# Patient Record
Sex: Male | Born: 1970 | Race: White | Hispanic: No | Smoking: Never smoker
Health system: Southern US, Community
[De-identification: ages and names within clinical notes are randomized; demographics above are authoritative.]

## PROBLEM LIST (undated history)

## (undated) DIAGNOSIS — I10 Essential (primary) hypertension: Secondary | ICD-10-CM

## (undated) DIAGNOSIS — I2510 Atherosclerotic heart disease of native coronary artery without angina pectoris: Secondary | ICD-10-CM

## (undated) DIAGNOSIS — E079 Disorder of thyroid, unspecified: Secondary | ICD-10-CM

## (undated) DIAGNOSIS — E119 Type 2 diabetes mellitus without complications: Secondary | ICD-10-CM

## (undated) DIAGNOSIS — Z87442 Personal history of urinary calculi: Secondary | ICD-10-CM

## (undated) DIAGNOSIS — L0390 Cellulitis, unspecified: Secondary | ICD-10-CM

## (undated) DIAGNOSIS — E039 Hypothyroidism, unspecified: Secondary | ICD-10-CM

## (undated) HISTORY — PX: THYROID SURGERY: SHX805

---

## 1999-12-04 ENCOUNTER — Ambulatory Visit (HOSPITAL_COMMUNITY): Admission: RE | Admit: 1999-12-04 | Discharge: 1999-12-04 | Payer: Self-pay | Admitting: Internal Medicine

## 1999-12-04 ENCOUNTER — Encounter: Payer: Self-pay | Admitting: *Deleted

## 2000-10-13 ENCOUNTER — Emergency Department (HOSPITAL_COMMUNITY): Admission: EM | Admit: 2000-10-13 | Discharge: 2000-10-13 | Payer: Self-pay | Admitting: Emergency Medicine

## 2001-08-05 ENCOUNTER — Emergency Department (HOSPITAL_COMMUNITY): Admission: EM | Admit: 2001-08-05 | Discharge: 2001-08-05 | Payer: Self-pay | Admitting: Emergency Medicine

## 2001-08-05 ENCOUNTER — Encounter: Payer: Self-pay | Admitting: Emergency Medicine

## 2001-10-04 DIAGNOSIS — L0390 Cellulitis, unspecified: Secondary | ICD-10-CM

## 2001-10-04 HISTORY — DX: Cellulitis, unspecified: L03.90

## 2002-06-26 ENCOUNTER — Emergency Department (HOSPITAL_COMMUNITY): Admission: EM | Admit: 2002-06-26 | Discharge: 2002-06-26 | Payer: Self-pay

## 2002-08-20 ENCOUNTER — Emergency Department (HOSPITAL_COMMUNITY): Admission: EM | Admit: 2002-08-20 | Discharge: 2002-08-20 | Payer: Self-pay | Admitting: Emergency Medicine

## 2002-08-20 ENCOUNTER — Encounter: Payer: Self-pay | Admitting: Emergency Medicine

## 2002-09-11 ENCOUNTER — Encounter: Payer: Self-pay | Admitting: Ophthalmology

## 2002-09-11 ENCOUNTER — Ambulatory Visit (HOSPITAL_COMMUNITY): Admission: RE | Admit: 2002-09-11 | Discharge: 2002-09-11 | Payer: Self-pay | Admitting: Ophthalmology

## 2003-06-22 ENCOUNTER — Encounter: Payer: Self-pay | Admitting: Emergency Medicine

## 2003-06-22 ENCOUNTER — Emergency Department (HOSPITAL_COMMUNITY): Admission: EM | Admit: 2003-06-22 | Discharge: 2003-06-22 | Payer: Self-pay | Admitting: Emergency Medicine

## 2003-06-23 ENCOUNTER — Emergency Department (HOSPITAL_COMMUNITY): Admission: EM | Admit: 2003-06-23 | Discharge: 2003-06-23 | Payer: Self-pay | Admitting: Emergency Medicine

## 2003-06-28 ENCOUNTER — Inpatient Hospital Stay (HOSPITAL_COMMUNITY): Admission: AD | Admit: 2003-06-28 | Discharge: 2003-07-01 | Payer: Self-pay | Admitting: Orthopaedic Surgery

## 2003-07-01 ENCOUNTER — Encounter: Payer: Self-pay | Admitting: Orthopaedic Surgery

## 2003-12-29 ENCOUNTER — Emergency Department (HOSPITAL_COMMUNITY): Admission: AD | Admit: 2003-12-29 | Discharge: 2003-12-29 | Payer: Self-pay | Admitting: Internal Medicine

## 2004-02-08 ENCOUNTER — Ambulatory Visit (HOSPITAL_BASED_OUTPATIENT_CLINIC_OR_DEPARTMENT_OTHER): Admission: RE | Admit: 2004-02-08 | Discharge: 2004-02-08 | Payer: Self-pay | Admitting: Family Medicine

## 2007-05-02 ENCOUNTER — Emergency Department (HOSPITAL_COMMUNITY): Admission: EM | Admit: 2007-05-02 | Discharge: 2007-05-02 | Payer: Self-pay | Admitting: Emergency Medicine

## 2008-01-30 ENCOUNTER — Encounter (INDEPENDENT_AMBULATORY_CARE_PROVIDER_SITE_OTHER): Payer: Self-pay | Admitting: Interventional Radiology

## 2008-01-30 ENCOUNTER — Encounter: Admission: RE | Admit: 2008-01-30 | Discharge: 2008-01-30 | Payer: Self-pay | Admitting: Internal Medicine

## 2008-01-30 ENCOUNTER — Other Ambulatory Visit: Admission: RE | Admit: 2008-01-30 | Discharge: 2008-01-30 | Payer: Self-pay | Admitting: Interventional Radiology

## 2008-04-08 ENCOUNTER — Encounter (HOSPITAL_BASED_OUTPATIENT_CLINIC_OR_DEPARTMENT_OTHER): Payer: Self-pay | Admitting: General Surgery

## 2008-04-08 ENCOUNTER — Ambulatory Visit (HOSPITAL_COMMUNITY): Admission: RE | Admit: 2008-04-08 | Discharge: 2008-04-09 | Payer: Self-pay | Admitting: General Surgery

## 2008-04-18 ENCOUNTER — Emergency Department (HOSPITAL_COMMUNITY): Admission: EM | Admit: 2008-04-18 | Discharge: 2008-04-18 | Payer: Self-pay | Admitting: Family Medicine

## 2008-04-18 ENCOUNTER — Emergency Department (HOSPITAL_COMMUNITY): Admission: EM | Admit: 2008-04-18 | Discharge: 2008-04-18 | Payer: Self-pay | Admitting: Emergency Medicine

## 2009-01-25 ENCOUNTER — Emergency Department (HOSPITAL_COMMUNITY): Admission: EM | Admit: 2009-01-25 | Discharge: 2009-01-25 | Payer: Self-pay | Admitting: Emergency Medicine

## 2009-03-18 ENCOUNTER — Emergency Department (HOSPITAL_COMMUNITY): Admission: EM | Admit: 2009-03-18 | Discharge: 2009-03-18 | Payer: Self-pay | Admitting: Emergency Medicine

## 2010-09-10 ENCOUNTER — Emergency Department (HOSPITAL_COMMUNITY): Admission: EM | Admit: 2010-09-10 | Discharge: 2010-02-12 | Payer: Self-pay | Admitting: Emergency Medicine

## 2010-10-26 ENCOUNTER — Encounter: Payer: Self-pay | Admitting: Emergency Medicine

## 2010-12-22 LAB — CBC
MCHC: 35.7 g/dL (ref 30.0–36.0)
Platelets: 167 10*3/uL (ref 150–400)
RBC: 4.67 MIL/uL (ref 4.22–5.81)

## 2010-12-22 LAB — URINALYSIS, ROUTINE W REFLEX MICROSCOPIC
Bilirubin Urine: NEGATIVE
Glucose, UA: NEGATIVE mg/dL
Hgb urine dipstick: NEGATIVE
Ketones, ur: NEGATIVE mg/dL
Protein, ur: NEGATIVE mg/dL
pH: 6 (ref 5.0–8.0)

## 2010-12-22 LAB — DIFFERENTIAL
Basophils Absolute: 0.1 10*3/uL (ref 0.0–0.1)
Basophils Relative: 1 % (ref 0–1)
Monocytes Relative: 7 % (ref 3–12)
Neutro Abs: 3.8 10*3/uL (ref 1.7–7.7)
Neutrophils Relative %: 62 % (ref 43–77)

## 2010-12-22 LAB — D-DIMER, QUANTITATIVE: D-Dimer, Quant: <0.22 ug/mL-FEU (ref 0.00–0.48)

## 2010-12-22 LAB — BASIC METABOLIC PANEL
BUN: 17 mg/dL (ref 6–23)
CO2: 25 mEq/L (ref 19–32)
Calcium: 8.8 mg/dL (ref 8.4–10.5)
Creatinine, Ser: 1.28 mg/dL (ref 0.4–1.5)
GFR calc Af Amer: >60 mL/min (ref >60)

## 2010-12-22 LAB — POCT CARDIAC MARKERS
CKMB, poc: 3.5 ng/mL (ref 1.0–8.0)
Myoglobin, poc: 87.1 ng/mL (ref 12–200)
Troponin i, poc: <0.05 ng/mL (ref 0.00–0.09)

## 2011-01-11 LAB — URINALYSIS, ROUTINE W REFLEX MICROSCOPIC
Glucose, UA: NEGATIVE mg/dL
Ketones, ur: NEGATIVE mg/dL
Nitrite: NEGATIVE
Protein, ur: NEGATIVE mg/dL
pH: 6.5 (ref 5.0–8.0)

## 2011-01-11 LAB — CBC
HCT: 46.2 % (ref 39.0–52.0)
MCV: 86.5 fL (ref 78.0–100.0)
RBC: 5.34 MIL/uL (ref 4.22–5.81)
WBC: 6.9 10*3/uL (ref 4.0–10.5)

## 2011-01-11 LAB — TROPONIN I
Troponin I: 0.01 ng/mL (ref 0.00–0.06)
Troponin I: 0.02 ng/mL (ref 0.00–0.06)

## 2011-01-11 LAB — CK TOTAL AND CKMB (NOT AT ARMC)
CK, MB: 1.6 ng/mL (ref 0.3–4.0)
CK, MB: 1.8 ng/mL (ref 0.3–4.0)
Relative Index: INVALID (ref 0.0–2.5)
Total CK: 68 U/L (ref 7–232)

## 2011-01-11 LAB — DIFFERENTIAL
Basophils Relative: 1 % (ref 0–1)
Eosinophils Absolute: 0.1 10*3/uL (ref 0.0–0.7)
Lymphs Abs: 2.2 10*3/uL (ref 0.7–4.0)
Monocytes Relative: 5 % (ref 3–12)
Neutro Abs: 4.2 10*3/uL (ref 1.7–7.7)
Neutrophils Relative %: 60 % (ref 43–77)

## 2011-01-11 LAB — BASIC METABOLIC PANEL
Chloride: 103 mEq/L (ref 96–112)
GFR calc Af Amer: >60 mL/min (ref >60)
GFR calc non Af Amer: >60 mL/min (ref >60)
Potassium: 3.6 mEq/L (ref 3.5–5.1)
Sodium: 138 mEq/L (ref 135–145)

## 2011-01-11 LAB — RAPID URINE DRUG SCREEN, HOSP PERFORMED
Barbiturates: NOT DETECTED
Benzodiazepines: NOT DETECTED

## 2011-01-13 LAB — CBC
HCT: 45.3 % (ref 39.0–52.0)
Hemoglobin: 15.9 g/dL (ref 13.0–17.0)
MCHC: 35.2 g/dL (ref 30.0–36.0)
MCV: 85.3 fL (ref 78.0–100.0)
Platelets: 170 10*3/uL (ref 150–400)
RDW: 13.9 % (ref 11.5–15.5)

## 2011-01-13 LAB — URINALYSIS, ROUTINE W REFLEX MICROSCOPIC
Ketones, ur: NEGATIVE mg/dL
Leukocytes, UA: NEGATIVE
Nitrite: NEGATIVE
Protein, ur: NEGATIVE mg/dL
Urobilinogen, UA: 1 mg/dL (ref 0.0–1.0)

## 2011-01-13 LAB — COMPREHENSIVE METABOLIC PANEL
Alkaline Phosphatase: 56 U/L (ref 39–117)
BUN: 14 mg/dL (ref 6–23)
Creatinine, Ser: 1.01 mg/dL (ref 0.4–1.5)
Glucose, Bld: 138 mg/dL — ABNORMAL HIGH (ref 70–99)
Potassium: 3.7 mEq/L (ref 3.5–5.1)
Total Protein: 7.6 g/dL (ref 6.0–8.3)

## 2011-01-13 LAB — URINE MICROSCOPIC-ADD ON

## 2011-02-16 NOTE — Op Note (Signed)
NAME:  [name], [name] NO.:  [id number]   MEDICAL RECORD NO.:  [id number]          PATIENT TYPE:  OIB   LOCATION:  5125                         FACILITY:  [facility]   PHYSICIAN:  Leonie Man, M.D.   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   PATHOLOGY:  Pending.   PROCEDURE:  Right thyroid lobectomy and isthmusectomy.   SURGEON:  Leonie Man, MD   ASSISTANT:  Dr. Luanna Salk.   ANESTHESIA:  General.   Note, Kirk Lewis is a 40 year old gentleman with an enlarging mass of  the right thyroid which on biopsy shows a Hurthle cell lesion. Carcinoma  is not ruled out.  He comes to the operating room now after the risks  and potential benefits of surgery have been discussed with him. Specific  risks to include hypoparathyroidism and/or recurrent laryngeal nerve  injury.  He understands and gives his consent to surgery.   PROCEDURE:  The patient was positioned supinely, and following the  induction of satisfactory general anesthesia, the neck and anterior  chest were prepped and draped to be included in a sterile operative  field.  Positive identification of the patient as Kirk Lewis and the  procedure to be done as right hemithyroidectomy and isthmusectomy was  carried out.  I made a transverse collar incision in the neck extending  from the anterior border of the sternocleidomastoid muscle on the right  and onto the left, deepening this through skin and subcutaneous tissue  and across the platysma muscle.  A superior myocutaneous flap was raised  up to the thyroid cartilage and an inferior myocutaneous flap was  carried down to the sternal notch.  The strap muscles were opened in the  midline and dissection was carried over onto the right side of the neck.  The right thyroid lobe was then mobilized completely and the superior  thyroid vessels were secured with ties of 2-0 silk and clips and then  transected.  The thyroid was then dissected carefully from surrounding  tissues being careful to avoid the superior and inferior parathyroid  glands and the area of the recurrent laryngeal nerve.  The thyroid was  dissected free from the trachea.  A small area of thyroid tissue was  left at the tubercle of Zuckerkandl and dissection was carried across  the midline, taking both the isthmus and the pyramidal lobe along with  the right thyroid lobe.  The specimen was forwarded for pathologic  evaluation and frozen section of this confirmed a Hurthle cell lesion;  however, the diagnosis of carcinoma could not be made at this time.  We  chose to end the procedure at this time.  The right neck was then  thoroughly checked for hemostasis.  Additional bleeding points were  treated with electrocautery.  All areas of dissection were covered with  Surgicel.  Sponge, instrument, and sharp counts were doubly verified and  the strap muscles were closed in the midline with interrupted 3-0 Vicryl  sutures.  The subcutaneous tissues and platysma muscle were closed with  interrupted 3-0 Vicryl sutures.  Skin was closed with 5-0 Monocryl  suture and reinforced with Steri-Strips.  Sterile dressings were  applied.  The anesthetic was reversed and the patient was moved from the  operating room to the recovery room in stable condition.  He tolerated  the procedure well.      Leonie Man, M.D.  Electronically Signed     PB/MEDQ  D:  04/08/2008  T:  04/09/2008  Job:  [id number]

## 2011-02-19 NOTE — Discharge Summary (Signed)
   NAME:  Kirk Lewis, Kirk Lewis                          ACCOUNT NO.:  [id number]   MEDICAL RECORD NO.:  [id number]                   PATIENT TYPE:  INP   LOCATION:  5023                                 FACILITY:  [facility]   PHYSICIAN:  Lubertha Basque. Jerl Santos, M.D.             DATE OF BIRTH:  June 04, 1971   DATE OF ADMISSION:  06/28/2003  DATE OF DISCHARGE:  07/01/2003                                 DISCHARGE SUMMARY   ADMISSION DIAGNOSIS:  Prepatellar cellulitis, left knee.   DISCHARGE DIAGNOSIS:  Prepatellar cellulitis, left knee.   OPERATIONS:  None.   HISTORY OF PRESENT ILLNESS:  Kirk Lewis is a 40 year old white male who is a  patient of Dr. Maebelle Munroe who is a doctor in our practice.  In the office on  the day of admission, Dr. Althea Charon had seen this patient for evaluation and  noted he had significant cellulitis and swelling in his left prepatellar  bursa.  He has been on Tequin, Percocet and has been making minimal to no  advance.  Rocephin was given at the time of his office visit.  Because of  pain and redness, it was decided he go to the hospital IV antibiotics.   PERTINENT LABORATORY AND X-RAY DATA:  WBC 11.1, RBC 4.84, hemoglobin 14.5,  hematocrit ___________, _________0.7; neutrophils of 78.  Routine chemistry  showed a glucose of _____________________________________________________  AST _________________________.   HOSPITAL COURSE:  The patient was admitted from our office, he was placed on  IV antibiotics which included Kefzol 2 g q.8h IV.  A moist heating pad was  put on his leg and he was given a pain medication which essentially was  Percocet and morphine as necessary.  He was on IV Ancef 2 g q.8h.  His  aspiration culture and sensitivity revealed Staph with multiple  sensitivities.  He lessening of pain.  A PICC line was put in because he was  going to receive antibiotics for approximately two weeks, this was going to  be regulated and supplied by [place], and he was  discharged home.   CONDITION ON DISCHARGE:  Improved.   DISCHARGE MEDICATIONS:  1. He will be on 1 g of Ancef IV q.8h. for two weeks.  2. Percocet 1-2 p.o. q.4-6h. p.r.n. for pain.   The home agency is to regulate this IV antibiotic was [place].  His pain  management was Percocet.   ACTIVITY:  Moist heat to his knee, crutches as needed.  Wound care not  applicable.    DIET:  Unrestricted.   FOLLOWUP:  He will return to our office in one week's time for recheck.      Lindwood Qua, P.A.                    Lubertha Basque Jerl Santos, M.D.    MC/MEDQ  D:  07/15/2003  T:  07/15/2003  Job:  [id number]

## 2011-07-01 ENCOUNTER — Emergency Department (HOSPITAL_COMMUNITY): Payer: Managed Care, Other (non HMO)

## 2011-07-01 ENCOUNTER — Observation Stay (HOSPITAL_COMMUNITY)
Admission: EM | Admit: 2011-07-01 | Discharge: 2011-07-02 | Disposition: A | Discharge status: Home / Self Care | Payer: Managed Care, Other (non HMO) | Attending: Internal Medicine | Admitting: Internal Medicine

## 2011-07-01 DIAGNOSIS — Z7982 Long term (current) use of aspirin: Secondary | ICD-10-CM | POA: Insufficient documentation

## 2011-07-01 DIAGNOSIS — R911 Solitary pulmonary nodule: Secondary | ICD-10-CM | POA: Insufficient documentation

## 2011-07-01 DIAGNOSIS — R079 Chest pain, unspecified: Principal | ICD-10-CM | POA: Insufficient documentation

## 2011-07-01 DIAGNOSIS — E785 Hyperlipidemia, unspecified: Secondary | ICD-10-CM | POA: Insufficient documentation

## 2011-07-01 DIAGNOSIS — R109 Unspecified abdominal pain: Secondary | ICD-10-CM | POA: Insufficient documentation

## 2011-07-01 DIAGNOSIS — Z79899 Other long term (current) drug therapy: Secondary | ICD-10-CM | POA: Insufficient documentation

## 2011-07-01 DIAGNOSIS — K219 Gastro-esophageal reflux disease without esophagitis: Secondary | ICD-10-CM | POA: Insufficient documentation

## 2011-07-01 DIAGNOSIS — K2970 Gastritis, unspecified, without bleeding: Secondary | ICD-10-CM | POA: Insufficient documentation

## 2011-07-01 DIAGNOSIS — Z87442 Personal history of urinary calculi: Secondary | ICD-10-CM | POA: Insufficient documentation

## 2011-07-01 DIAGNOSIS — Z8249 Family history of ischemic heart disease and other diseases of the circulatory system: Secondary | ICD-10-CM | POA: Insufficient documentation

## 2011-07-01 DIAGNOSIS — G4730 Sleep apnea, unspecified: Secondary | ICD-10-CM | POA: Insufficient documentation

## 2011-07-01 DIAGNOSIS — I10 Essential (primary) hypertension: Secondary | ICD-10-CM | POA: Insufficient documentation

## 2011-07-01 LAB — CBC
HCT: 42.3 % (ref 39.0–52.0)
MCHC: 35.9 g/dL (ref 30.0–36.0)
MCV: 84.3
Platelets: 159
RBC: 4.95
RDW: 13 % (ref 11.5–15.5)
WBC: 5.9 10*3/uL (ref 4.0–10.5)
WBC: 4.9

## 2011-07-01 LAB — CARDIAC PANEL(CRET KIN+CKTOT+MB+TROPI)
CK, MB: 1.9 ng/mL (ref 0.3–4.0)
Relative Index: INVALID (ref 0.0–2.5)
Relative Index: INVALID (ref 0.0–2.5)
Total CK: 69 U/L (ref 7–232)
Total CK: 60 U/L (ref 7–232)

## 2011-07-01 LAB — BASIC METABOLIC PANEL
CO2: 25 mEq/L (ref 19–32)
Calcium: 9.3 mg/dL (ref 8.4–10.5)
Creatinine, Ser: 1 mg/dL (ref 0.50–1.35)
GFR calc Af Amer: >60 mL/min (ref >60)
GFR calc non Af Amer: >60 mL/min (ref >60)
Sodium: 137 mEq/L (ref 135–145)

## 2011-07-01 LAB — DIFFERENTIAL
Basophils Absolute: 0 10*3/uL (ref 0.0–0.1)
Basophils Absolute: 0
Basophils Relative: 0 % (ref 0–1)
Eosinophils Absolute: 0.1
Eosinophils Relative: 1 % (ref 0–5)
Eosinophils Relative: 2
Lymphocytes Relative: 24 % (ref 12–46)
Lymphocytes Relative: 32
Monocytes Absolute: 0.4 10*3/uL (ref 0.1–1.0)
Monocytes Absolute: 0.6
Neutro Abs: 3.9 10*3/uL (ref 1.7–7.7)

## 2011-07-01 LAB — COMPREHENSIVE METABOLIC PANEL
ALT: 25
AST: 20
Albumin: 4.2
Alkaline Phosphatase: 55
CO2: 26
Chloride: 106
Creatinine, Ser: 0.99
GFR calc Af Amer: >60
GFR calc non Af Amer: >60
Potassium: 4.5
Sodium: 139
Total Bilirubin: 1.1

## 2011-07-01 LAB — PROTIME-INR: Prothrombin Time: 13.7

## 2011-07-01 LAB — POCT I-STAT TROPONIN I: Troponin i, poc: 0 ng/mL (ref 0.00–0.08)

## 2011-07-01 MED ORDER — IOHEXOL 300 MG/ML  SOLN
100.0000 mL | Freq: Once | INTRAMUSCULAR | Status: AC | PRN
  Filled 2011-07-01: qty 100

## 2011-07-02 ENCOUNTER — Ambulatory Visit (HOSPITAL_COMMUNITY)
Admit: 2011-07-02 | Discharge: 2011-07-02 | Disposition: A | Discharge status: Home / Self Care | Payer: Managed Care, Other (non HMO) | Source: Ambulatory Visit | Attending: Cardiovascular Disease | Admitting: Cardiovascular Disease

## 2011-07-02 DIAGNOSIS — R079 Chest pain, unspecified: Secondary | ICD-10-CM | POA: Insufficient documentation

## 2011-07-02 LAB — CARDIAC PANEL(CRET KIN+CKTOT+MB+TROPI)
CK, MB: 1.8 ng/mL (ref 0.3–4.0)
Relative Index: INVALID (ref 0.0–2.5)
Total CK: 49 U/L (ref 7–232)
Troponin I: <0.3 ng/mL (ref <0.30)

## 2011-07-02 LAB — CBC
MCH: 29.5 pg (ref 26.0–34.0)
MCV: 83.8 fL (ref 78.0–100.0)
Platelets: 203 10*3/uL (ref 150–400)
RDW: 13.5 % (ref 11.5–15.5)

## 2011-07-02 LAB — DIFFERENTIAL
Basophils Relative: 1 % (ref 0–1)
Eosinophils Absolute: 0.2 10*3/uL (ref 0.0–0.7)
Eosinophils Relative: 2 % (ref 0–5)
Lymphs Abs: 2.5 10*3/uL (ref 0.7–4.0)
Monocytes Relative: 8 % (ref 3–12)

## 2011-07-02 LAB — POCT URINALYSIS DIP (DEVICE)
Glucose, UA: NEGATIVE
Ketones, ur: NEGATIVE
Nitrite: NEGATIVE
Specific Gravity, Urine: 1.025

## 2011-07-02 LAB — LIPID PANEL
Cholesterol: 126 mg/dL (ref 0–200)
VLDL: UNDETERMINED mg/dL (ref 0–40)

## 2011-07-02 LAB — COMPREHENSIVE METABOLIC PANEL
AST: 23 U/L (ref 0–37)
Albumin: 3.9 g/dL (ref 3.5–5.2)
Calcium: 9.1 mg/dL (ref 8.4–10.5)
Creatinine, Ser: 1.21 mg/dL (ref 0.50–1.35)
Sodium: 133 mEq/L — ABNORMAL LOW (ref 135–145)
Total Protein: 7 g/dL (ref 6.0–8.3)

## 2011-07-02 LAB — HEMOGLOBIN A1C: Mean Plasma Glucose: 126 mg/dL — ABNORMAL HIGH (ref <117)

## 2011-07-02 MED ORDER — TECHNETIUM TC 99M TETROFOSMIN IV KIT
30.0000 | PACK | Freq: Once | INTRAVENOUS | Status: AC | PRN
  Administered 2011-07-02: 30 via INTRAVENOUS

## 2011-07-02 MED ORDER — TECHNETIUM TC 99M TETROFOSMIN IV KIT
10.0000 | PACK | Freq: Once | INTRAVENOUS | Status: AC | PRN
  Administered 2011-07-02: 10 via INTRAVENOUS

## 2011-07-02 NOTE — Consult Note (Signed)
NAME:  MARIANO, DOSHI NO.:  [id number]  MEDICAL RECORD NO.:  [id number]  LOCATION:  1422                         FACILITY[facility]  [facility]  PHYSICIAN:  Verne Carrow, MDDATE OF BIRTH:  [birth year]  DATE OF CONSULTATION:  07/01/2011 DATE OF DISCHARGE:                                CONSULTATION   PRIMARY CARE PHYSICIAN:  Dr. Chauncey Cruel  REASON FOR CONSULTATION:  Chest pain.  HISTORY OF PRESENT ILLNESS:  Mr. Doswell is a pleasant 40 year old Caucasian male with a history of hypertension, but no other chronic medical conditions who presents to the [facility] today with complaints of substernal chest pressure.  The patient does have a strong family history of coronary artery disease.  Both his father and brother were heavy smokers.  His father died of a myocardial infarction at age 83.  His brother had a myocardial infarction at age 81.  The patient is active and abides by a good healthy diet.  He exercises frequently.  He tells me that yesterday he began to have substernal chest pressure with no radiation.  There was no associated shortness of breath, diaphoresis, or nausea.  The patient presented to the emergency department after the pain did not resolve for 18 hours. He continues to have substernal chest pressure now.  He also describes recent acid reflux after meals and abdominal bloating after meals.  This has been occurring for several weeks.  He tells me that he had an exercise treadmill stress test in his primary care physician's office 1 year ago, which was normal.  His initial point-of-care troponin is negative.  PAST MEDICAL HISTORY:  Hypertension.  PAST SURGICAL HISTORY: 1. Partial thyroidectomy 2. Knee surgery.  ALLERGIES:  No known drug allergies.  HOME MEDICATIONS: 1. Aspirin 81 mg once daily. 2. Norvasc 5 mg once daily. 3. Lisinopril/HCTZ 20/25 mg once daily.  SOCIAL HISTORY:  The patient is divorced, has 3  children.  He denies use of tobacco or illicit drugs.  He uses alcohol socially.  FAMILY HISTORY:  The patient's mother died from diabetes-related complications.  His father died at age 89 from myocardial infarction. His brother had a myocardial infarction at age 35.  REVIEW OF SYSTEMS:  As stated in the history of present illness, is otherwise negative.  PHYSICAL EXAMINATION:  VITAL SIGNS:  Temperature 98.7, pulse 78 and regular, respirations 12 and unlabored, blood pressure 118/70.  GENERAL: He is a pleasant, young Caucasian male, in no acute distress.  He is alert and oriented x3. NECK:  No JVD.  No carotid bruits.  No thyromegaly.  No lymphadenopathy. SKIN:  Warm and dry. MUSCULOSKELETAL:  Moves all extremities equally. PSYCHIATRIC:  Mood and affect are appropriate. NEUROLOGIC:  Nonfocal. HEENT:  Normal. CARDIOVASCULAR:  Regular rate and rhythm without murmurs, gallops, or rubs noted. LUNGS:  Clear to auscultation bilaterally. ABDOMEN:  Soft, nontender.  Bowel sounds present. EXTREMITIES:  No evidence of edema.  Pulses are 2+ in all extremities.  DIAGNOSTIC STUDIES: 1. Laboratory values show hemoglobin of 15, creatinine 1.0, potassium     3.8, platelets 172.  Point-of-care troponin 0.0. 2. Chest x-ray shows no acute disease. 3. 12-lead EKG shows sinus  rhythm with no ischemic changes.  ASSESSMENT/PLAN:  Chest pain.  This is a 40 year old male with risk factors for coronary artery disease including hypertension and family history of coronary artery disease.  His glucose today is elevated, although he has no documented history of diabetes mellitus.  His initial troponin is negative, indicating no evidence of ischemia.  At this time, I agree with admitting the patient cycling cardiac enzymes.  I would also check a hemoglobin A1c in the morning as well as fasting lipids. We will also check an echocardiogram and make the patient n.p.o. at midnight.  If his cardiac markers are  negative, I would plan on an exercise treadmill stress Myoview in the morning to exclude ischemia. If this is normal, then I would pursue a GI workup.  We will be glad to follow with you.     Verne Carrow, MD     CM/MEDQ  D:  07/01/2011  T:  07/02/2011  Job:  [id number]  cc:   Dr. Chauncey Cruel  Electronically Signed by Verne Carrow MD on 07/02/2011 03:07:47 PM

## 2011-07-12 NOTE — H&P (Signed)
NAME:  Kirk Lewis, Kirk Lewis                ACCOUNT NO.:  [id number]  MEDICAL RECORD NO.:  [id number]  LOCATION:  1422                         FACILITY:  [facility]  PHYSICIAN:  Esty Ahuja, DO         DATE OF BIRTH:  [birth year]  DATE OF ADMISSION:  07/01/2011 DATE OF DISCHARGE:                             HISTORY & PHYSICAL   CHIEF COMPLAINT:  Chest pain.  HISTORY OF PRESENT ILLNESS:  The patient is a 40 year old male who states that he was at work yesterday, not particularly engaged in anything exertional, when he developed substernal chest pressure.  He states it felt like something was sitting on his chest.  It has been 10/10 in severity.  It has lasted continuously since yesterday.  It is made worse by eating, otherwise has no palliative or provocative factors.  It is accompanied by mild shortness of breath, nausea, and diaphoresis.  The patient denies any vomiting, fever, or chills with this.  PAST MEDICAL HISTORY: 1. Kidney stones. 2. Obstructive sleep apnea. 3. Hypertension.  PAST SURGICAL HISTORY:  Significant for partial thyroidectomy.  SOCIAL HISTORY:  No tobacco.  No alcohol.  No recreational drug use.  FAMILY HISTORY:  Father had an MI at age 77.  Brother had an MI at age 61.  ALLERGIES:  NKDA.  MEDICATIONS AT HOME: 1. Milk of magnesia 30 cc p.o. q.12 h. p.r.n. constipation. 2. Gas-X 1 tablet every 12 hours as needed for gas. 3. Aleve 220 mg 2 tablets every 12 hours p.r.n. pain 4. Enteric-coated aspirin 81 mg 2 tablets daily. 5. Ibuprofen 200 mg 4 tablets every 4 hours as needed for pain. 6. Lisinopril/hydrochlorothiazide 20/25 one p.o. daily. 7. Amlodipine 5 mg 1 p.o. daily.  REVIEW OF SYSTEMS:  CONSTITUTIONAL:  Negative for fever.  Negative for chills.  Negative for weakness.  Negative for fatigue.  CNS:  No headaches.  No seizures.  No limb weakness.  ENT:  No nasal congestion. No throat pain.  No coryza.  CARDIOVASCULAR:  Positive for chest pain/pressure.   Negative for palpitations.  Negative for orthopnea. RESPIRATORY:  Positive for mild shortness breath.  No cough.  No wheeze. No sputum production.  GASTROINTESTINAL:  Positive for a full feeling after he eats and bloating.  Positive for some mild constipation lately. GENITOURINARY:  No dysuria.  No hematuria.  No urinary frequency. RENAL:  No flank pain.  No swelling.  No pruritus.  SKIN:  No rashes. No sores.  No lesions.  HEMATOLOGIC:  No easy bruising.  No purpura.  No clots.  LYMPH:  No lymphadenopathy.  No painful lymph nodes, no specific lymph swelling.  PSYCHIATRIC:  No anxiety.  No depression.  No insomnia.  PHYSICAL EXAMINATION:  VITAL SIGNS:  Temperature 98.8, pulse 91, respiratory rate 17, blood pressure 144/83, O2 sat 100% room air. GENERAL:  The patient is awake, alert, and ordered x3.  He is moderately obese, well-nourished, well-developed male who is in mild distress from chest/abdominal discomfort at this time.  He states everything got worse after he ate. EYES:  Pupils are equal, round, and react to light and accommodation. External ocular movements bilaterally intact.  Sclerae nonicteric, noninjected. MOUTH:  Oral mucosa moist.  No lesions.  No sores. PHARYNX:  Clean erythema.  No exudate. NECK:  Negative for JVD.  Negative for thyromegaly.  Negative for lymphadenopathy. HEART:  Regular rate and rhythm at 80 beats per minute without murmur, ectopy, or gallops.  No lateral PMI.  No thrills. LUNGS:  Clear to auscultation bilaterally without wheezes, rales, or rhonchi.  No increased work of breathing.  No tactile fremitus. ABDOMEN:  Moderately obese, soft, nontender, nondistended.  Positive bowel sounds.  No hepatosplenomegaly.  No hernias palpated. EXTREMITIES:  Negative for cyanosis, clubbing, or edema.  The patient has positive dorsalis pedis and popliteal pulses bilaterally.  No carotid bruits bilaterally. NEUROLOGIC:  Cranial nerves II through XII gross intact.   Motor and sensory intact.  LABORATORY DATA:  WBC is 5.9, hemoglobin 15.2, hematocrit 42.3, platelets 172.  Sodium 137, potassium 3.8, chloride 101, CO2 of 25, BUN 16, creatinine is 1, glucose 156, calcium of 9.3.  Abdominal ultrasound showed fatty infiltration of the liver and bilateral nonobstructive renal calculi.  CT of the abdomen and pelvis was also performed demonstrating a 5 mm nodule on the right lower lobe as the patient is at low risk for lung cancer as he has no smoking or family history, I feel this is low risk.  Portal chest x-ray is negative for an acute cardiopulmonary pathology.  EKG demonstrated normal sinus rhythm.  ASSESSMENT: 1. Chest pain, differential diagnosis includes,     a.     Gastrointestinal, likely gastritis and/or esophagitis, given      the patient's heavy use of NSAIDs and the fact his symptoms      worsened after a meal.     b.     Acute coronary syndrome, the patient certainly has risk      factors for this, he has hypertension, he may have high      cholesterol, he feels like he probably does but he is not sure,      and he has a strong family history for early heart disease.     c.     Musculoskeletal. 2. The patient clearly does have some gastroesophageal reflux disease     symptoms. 3. Hypertension. 4. Hyperlipidemia.  PLAN: 1. Rule out MI by EKG and enzyme criteria. 2. Protonix. 3. Consult cardiology. 4. Control blood pressure. 5. Check a lipid panel.  I spent 40 minutes on this admission.          ______________________________ Fran Lowes, DO     AS/MEDQ  D:  07/01/2011  T:  07/02/2011  Job:  [id number]  Electronically Signed by Fran Lowes DO on 07/12/2011 03:46:29 PM

## 2011-07-13 NOTE — Discharge Summary (Signed)
NAME:  Kirk Lewis, GASCOIGNE NO.:  [id number]  MEDICAL RECORD NO.:  [id number]  LOCATION:                                 FACILITY:  PHYSICIAN:  Marinda Elk, M.D.DATE OF BIRTH:  Mar 01, 1971  DATE OF ADMISSION:  07/02/2011 DATE OF DISCHARGE:                              DISCHARGE SUMMARY   PRIMARY CARE DOCTOR:  Dr. Jearld Fenton  CONSULTANTS:  Verne Carrow, MD, Cardiology.  DISCHARGE DIAGNOSES: 1. Atypical chest pain probably secondary to gastroesophageal reflux     disease/gastritis. 2. Gastroesophageal reflux disease. 3. Hyperlipidemia.  DISCHARGE MEDICATIONS: 1. Protonix 40 mg p.o. b.i.d. 2. TriCor 140 mg p.o. daily. 3. Amlodipine 10 mg daily. 4. Aspirin 81 mg daily. 5. Keflex 1 tablet q.12 hours p.r.n. 6. Lisinopril/hydrochlorothiazide 20/25 mg one tab daily. 7. Milk of Magnesia 30 mL daily.  CONSULTANTS:  Cardiology, Verne Carrow, MD.  PROCEDURES PERFORMED:  Non-invasive exercise stress test.  No chest pain, shortness of breath.  Good exercise tolerance, breathing coming back.  No chest pain.  No EKG changes.  Vital signs stable.  No evidence of ischemia.  Normal left ventricular wall motion abnormality, EF of 53%.  Abdominal ultrasound showed no gallstones, small gallbladder sludge, diffuse fatty liver, bilateral renal calculi obstructions.  CT scan of the abdomen and pelvis showed appendix well visualized; normal ileum; renal calculi, right larger than left.  No hydronephrosis.  It revealed a calcified nodule in the right lower lobe.  Followup recommendation as above.  BRIEF ADMITTING HISTORY AND PHYSICAL:  This is a 40 year old male with a past medical history of hypertension and a strong family history of coronary artery disease, who developed substernal chest pain when he was sleeping.  It woke him up from his sleep, 10/10, continued since yesterday, made worse by eating, accompanied with shortness of breath, nausea,  vomiting, and diaphoresis.  So we were asked to admit him to further evaluate.  LABS:  On admission, shows a white count of 5, hemoglobin of 15, platelet count 172.  Sodium 137, potassium 3.8, chloride 101, bicarb of 25, BUN 16, creatinine 1, glucose 156, calcium 9.3.  BRIEF HOSPITAL COURSE: 1. Atypical chest pain most likely secondary to GERD/gastritis. 2. GERD/gastritis.  Exercise stress test was done, which was negative.     2-D echo is pending at the time of this dictation.  The patient has     been consuming NSAIDs, naproxen, and ibuprofen for several days     now, and his pain is made better with eating.  So this is most     likely secondary to NSAIDS.  I have done aggressive education on     dieting and exercise.  He has agreed.  I will start him on a PPI. 3. GERD.  No leg or arm symptoms.  Hemoglobin is stable.  No loss of     weight.  Now hemoglobin is stable, so we will start him on a PPI     twice a day, and he will follow up with his primary care doctor. 4. 5 mm noncalcified nodule.  He will need a repeated CT scan of the     abdomen and pelvis  in 12 months.  I will defer this to Dr. Norma Fredrickson. 5. Hyperlipidemia.  His triglycerides are quite high, 445.  I will     start him on TriCor.  His LFTs in the hospital were within normal     limits.  This will be repeated in 3 months by his primary care     doctor.  VITALS ON DAY OF DISCHARGE:  Shows a temperature of 98, pulse 70, respirations 18, blood pressure 110/76, he was sating 97% on room air.  LABS ON DAY OF DISCHARGE:  Shows a hemoglobin A1c of 6.0, triglycerides 447.  His HDL is 17.  His cardiac enzymes continue to be negative.  His sodium is 133, potassium 3.9, chloride 96, bicarb 30, glucose 124, BUN 16, creatinine 1.2.  LFTs within normal limits.  He has white count of 7.7, hemoglobin of 15, platelet count 203, ANC of 4.4.  The patient will follow up with Dr. Norma Fredrickson in 6 weeks here.  We will check his LFTs.  We will  continue to reinforce diet and exercise to try to continue to decrease his risk factors for any cardiac events.  The patient is currently stable.     Marinda Elk, M.D.     AF/MEDQ  D:  07/02/2011  T:  07/02/2011  Job:  [id number]  Electronically Signed by Marinda Elk M.D. on 07/13/2011 08:31:14 AM

## 2011-07-19 LAB — POCT URINALYSIS DIP (DEVICE)
Bilirubin Urine: NEGATIVE
Nitrite: NEGATIVE
pH: 6.5

## 2012-02-13 ENCOUNTER — Emergency Department (HOSPITAL_COMMUNITY): Payer: Managed Care, Other (non HMO)

## 2012-02-13 ENCOUNTER — Encounter (HOSPITAL_COMMUNITY): Payer: Self-pay | Admitting: Emergency Medicine

## 2012-02-13 ENCOUNTER — Inpatient Hospital Stay (HOSPITAL_COMMUNITY)
Admission: EM | Admit: 2012-02-13 | Discharge: 2012-02-15 | DRG: 603 | Disposition: A | Discharge status: Home / Self Care | Payer: Managed Care, Other (non HMO) | Attending: Internal Medicine | Admitting: Internal Medicine

## 2012-02-13 DIAGNOSIS — Z8639 Personal history of other endocrine, nutritional and metabolic disease: Secondary | ICD-10-CM | POA: Diagnosis present

## 2012-02-13 DIAGNOSIS — E663 Overweight: Secondary | ICD-10-CM | POA: Diagnosis present

## 2012-02-13 DIAGNOSIS — L0390 Cellulitis, unspecified: Secondary | ICD-10-CM | POA: Diagnosis present

## 2012-02-13 DIAGNOSIS — G4710 Hypersomnia, unspecified: Secondary | ICD-10-CM

## 2012-02-13 DIAGNOSIS — L03039 Cellulitis of unspecified toe: Secondary | ICD-10-CM

## 2012-02-13 DIAGNOSIS — R739 Hyperglycemia, unspecified: Secondary | ICD-10-CM | POA: Diagnosis present

## 2012-02-13 DIAGNOSIS — I10 Essential (primary) hypertension: Secondary | ICD-10-CM

## 2012-02-13 DIAGNOSIS — K219 Gastro-esophageal reflux disease without esophagitis: Secondary | ICD-10-CM | POA: Diagnosis present

## 2012-02-13 DIAGNOSIS — R7989 Other specified abnormal findings of blood chemistry: Secondary | ICD-10-CM

## 2012-02-13 DIAGNOSIS — L02619 Cutaneous abscess of unspecified foot: Secondary | ICD-10-CM

## 2012-02-13 DIAGNOSIS — L02419 Cutaneous abscess of limb, unspecified: Principal | ICD-10-CM | POA: Diagnosis present

## 2012-02-13 DIAGNOSIS — E876 Hypokalemia: Secondary | ICD-10-CM | POA: Diagnosis present

## 2012-02-13 DIAGNOSIS — Z6836 Body mass index (BMI) 36.0-36.9, adult: Secondary | ICD-10-CM

## 2012-02-13 DIAGNOSIS — G4733 Obstructive sleep apnea (adult) (pediatric): Secondary | ICD-10-CM | POA: Diagnosis present

## 2012-02-13 DIAGNOSIS — R509 Fever, unspecified: Secondary | ICD-10-CM | POA: Diagnosis present

## 2012-02-13 DIAGNOSIS — E119 Type 2 diabetes mellitus without complications: Secondary | ICD-10-CM | POA: Diagnosis present

## 2012-02-13 DIAGNOSIS — G4730 Sleep apnea, unspecified: Secondary | ICD-10-CM

## 2012-02-13 DIAGNOSIS — E079 Disorder of thyroid, unspecified: Secondary | ICD-10-CM | POA: Diagnosis present

## 2012-02-13 HISTORY — DX: Essential (primary) hypertension: I10

## 2012-02-13 HISTORY — DX: Cellulitis, unspecified: L03.90

## 2012-02-13 HISTORY — DX: Disorder of thyroid, unspecified: E07.9

## 2012-02-13 LAB — BASIC METABOLIC PANEL
BUN: 16 mg/dL (ref 6–23)
Calcium: 9.2 mg/dL (ref 8.4–10.5)
Creatinine, Ser: 1.11 mg/dL (ref 0.50–1.35)
GFR calc Af Amer: >90 mL/min (ref >90)
GFR calc non Af Amer: 81 mL/min — ABNORMAL LOW (ref >90)

## 2012-02-13 LAB — DIFFERENTIAL
Basophils Relative: 0 % (ref 0–1)
Eosinophils Absolute: 0.1 10*3/uL (ref 0.0–0.7)
Eosinophils Relative: 1 % (ref 0–5)
Monocytes Absolute: 0.7 10*3/uL (ref 0.1–1.0)
Monocytes Relative: 7 % (ref 3–12)
Neutrophils Relative %: 85 % — ABNORMAL HIGH (ref 43–77)

## 2012-02-13 LAB — CBC
HCT: 40.9 % (ref 39.0–52.0)
Hemoglobin: 14.4 g/dL (ref 13.0–17.0)
MCH: 28.8 pg (ref 26.0–34.0)
MCHC: 35.2 g/dL (ref 30.0–36.0)

## 2012-02-13 LAB — GLUCOSE, CAPILLARY: Glucose-Capillary: 153 mg/dL — ABNORMAL HIGH (ref 70–99)

## 2012-02-13 LAB — MAGNESIUM: Magnesium: 1.8 mg/dL (ref 1.5–2.5)

## 2012-02-13 LAB — SEDIMENTATION RATE: Sed Rate: 3 mm/hr (ref 0–16)

## 2012-02-13 MED ORDER — ENOXAPARIN SODIUM 40 MG/0.4ML ~~LOC~~ SOLN: 40.0000 mg | SUBCUTANEOUS | Status: DC

## 2012-02-13 MED ORDER — MORPHINE SULFATE 2 MG/ML IJ SOLN
1.0000 mg | Freq: Four times a day (QID) | INTRAMUSCULAR | Status: DC | PRN
  Administered 2012-02-14: 1 mg via INTRAVENOUS
  Filled 2012-02-13: qty 1

## 2012-02-13 MED ORDER — LISINOPRIL-HYDROCHLOROTHIAZIDE 20-25 MG PO TABS: 1.0000 | ORAL_TABLET | Freq: Every day | ORAL | Status: DC

## 2012-02-13 MED ORDER — FENTANYL CITRATE 0.05 MG/ML IJ SOLN
100.0000 ug | Freq: Once | INTRAMUSCULAR | Status: AC
  Administered 2012-02-13: 100 ug via INTRAVENOUS
  Filled 2012-02-13: qty 2

## 2012-02-13 MED ORDER — OXYCODONE HCL 5 MG PO TABS
5.0000 mg | ORAL_TABLET | ORAL | Status: DC | PRN
  Administered 2012-02-13 – 2012-02-15 (×7): 5 mg via ORAL
  Filled 2012-02-13 (×7): qty 1

## 2012-02-13 MED ORDER — FENTANYL CITRATE 0.05 MG/ML IJ SOLN
50.0000 ug | Freq: Once | INTRAMUSCULAR | Status: AC
  Administered 2012-02-13: 50 ug via INTRAVENOUS
  Filled 2012-02-13: qty 2

## 2012-02-13 MED ORDER — PANTOPRAZOLE SODIUM 40 MG PO TBEC
40.0000 mg | DELAYED_RELEASE_TABLET | Freq: Every day | ORAL | Status: DC
Start: 2012-02-13 — End: 2012-02-15
  Administered 2012-02-13 – 2012-02-15 (×3): 40 mg via ORAL
  Filled 2012-02-13 (×3): qty 1

## 2012-02-13 MED ORDER — LISINOPRIL 20 MG PO TABS
20.0000 mg | ORAL_TABLET | Freq: Every day | ORAL | Status: DC
  Administered 2012-02-13 – 2012-02-15 (×3): 20 mg via ORAL
  Filled 2012-02-13 (×3): qty 1

## 2012-02-13 MED ORDER — FENTANYL CITRATE 0.05 MG/ML IJ SOLN
50.0000 ug | Freq: Once | INTRAMUSCULAR | Status: AC
  Administered 2012-02-13: 50 ug via INTRAVENOUS

## 2012-02-13 MED ORDER — ACETAMINOPHEN 325 MG PO TABS
650.0000 mg | ORAL_TABLET | Freq: Four times a day (QID) | ORAL | Status: DC | PRN
  Administered 2012-02-13 – 2012-02-14 (×4): 650 mg via ORAL
  Filled 2012-02-13 (×4): qty 2

## 2012-02-13 MED ORDER — ONDANSETRON HCL 4 MG PO TABS
4.0000 mg | ORAL_TABLET | Freq: Four times a day (QID) | ORAL | Status: DC | PRN
  Administered 2012-02-15: 4 mg via ORAL
  Filled 2012-02-13: qty 1

## 2012-02-13 MED ORDER — IBUPROFEN 200 MG PO TABS
400.0000 mg | ORAL_TABLET | Freq: Once | ORAL | Status: AC
  Administered 2012-02-13: 400 mg via ORAL

## 2012-02-13 MED ORDER — ENOXAPARIN SODIUM 60 MG/0.6ML ~~LOC~~ SOLN
60.0000 mg | SUBCUTANEOUS | Status: DC
  Administered 2012-02-13 – 2012-02-14 (×2): 60 mg via SUBCUTANEOUS
  Filled 2012-02-13 (×3): qty 0.6

## 2012-02-13 MED ORDER — ACETAMINOPHEN 325 MG PO TABS
ORAL_TABLET | ORAL | Status: AC
  Filled 2012-02-13: qty 2

## 2012-02-13 MED ORDER — POTASSIUM CHLORIDE CRYS ER 20 MEQ PO TBCR
40.0000 meq | EXTENDED_RELEASE_TABLET | ORAL | Status: AC
  Administered 2012-02-13 (×2): 40 meq via ORAL
  Filled 2012-02-13 (×2): qty 2

## 2012-02-13 MED ORDER — IBUPROFEN 200 MG PO TABS
ORAL_TABLET | ORAL | Status: AC
  Administered 2012-02-13: 400 mg via ORAL
  Filled 2012-02-13: qty 2

## 2012-02-13 MED ORDER — SODIUM CHLORIDE 0.9 % IV SOLN
INTRAVENOUS | Status: DC
  Administered 2012-02-13: 02:00:00 via INTRAVENOUS

## 2012-02-13 MED ORDER — SODIUM CHLORIDE 0.9 % IV SOLN
INTRAVENOUS | Status: DC
  Administered 2012-02-13: 21:00:00 via INTRAVENOUS
  Administered 2012-02-14: 20 mL/h via INTRAVENOUS

## 2012-02-13 MED ORDER — HYDROCHLOROTHIAZIDE 25 MG PO TABS
25.0000 mg | ORAL_TABLET | Freq: Every day | ORAL | Status: DC
  Administered 2012-02-13 – 2012-02-15 (×3): 25 mg via ORAL
  Filled 2012-02-13 (×3): qty 1

## 2012-02-13 MED ORDER — INSULIN ASPART 100 UNIT/ML ~~LOC~~ SOLN
0.0000 [IU] | Freq: Three times a day (TID) | SUBCUTANEOUS | Status: DC
  Administered 2012-02-13: 2 [IU] via SUBCUTANEOUS
  Administered 2012-02-14 (×2): 1 [IU] via SUBCUTANEOUS
  Administered 2012-02-14: 2 [IU] via SUBCUTANEOUS
  Administered 2012-02-15: 1 [IU] via SUBCUTANEOUS
  Administered 2012-02-15: 2 [IU] via SUBCUTANEOUS

## 2012-02-13 MED ORDER — VANCOMYCIN HCL IN DEXTROSE 1-5 GM/200ML-% IV SOLN
1000.0000 mg | Freq: Once | INTRAVENOUS | Status: AC
  Administered 2012-02-13: 1000 mg via INTRAVENOUS
  Filled 2012-02-13: qty 200

## 2012-02-13 MED ORDER — ONDANSETRON HCL 4 MG/2ML IJ SOLN: 4.0000 mg | Freq: Four times a day (QID) | INTRAMUSCULAR | Status: DC | PRN

## 2012-02-13 MED ORDER — IBUPROFEN 800 MG PO TABS
400.0000 mg | ORAL_TABLET | Freq: Three times a day (TID) | ORAL | Status: DC | PRN
  Administered 2012-02-13 – 2012-02-15 (×4): 400 mg via ORAL
  Filled 2012-02-13 (×4): qty 1

## 2012-02-13 MED ORDER — IBUPROFEN 200 MG PO TABS
ORAL_TABLET | ORAL | Status: AC
  Filled 2012-02-13: qty 1

## 2012-02-13 MED ORDER — FENTANYL CITRATE 0.05 MG/ML IJ SOLN
INTRAMUSCULAR | Status: AC
  Filled 2012-02-13: qty 2

## 2012-02-13 MED ORDER — AMLODIPINE BESYLATE 5 MG PO TABS
5.0000 mg | ORAL_TABLET | Freq: Every day | ORAL | Status: DC
  Administered 2012-02-13 – 2012-02-15 (×3): 5 mg via ORAL
  Filled 2012-02-13 (×3): qty 1

## 2012-02-13 MED ORDER — ACETAMINOPHEN 325 MG PO TABS
650.0000 mg | ORAL_TABLET | Freq: Once | ORAL | Status: AC
  Administered 2012-02-13: 650 mg via ORAL

## 2012-02-13 NOTE — ED Notes (Signed)
Pt c/o R knee pain, presents w/ redness, swelling, c/o chills and fever at home. Hx of cellulitis of R knee, states "same as before."

## 2012-02-13 NOTE — ED Notes (Signed)
Pt c/o R knee pain, redness and swelling onset this am. + fever at home

## 2012-02-13 NOTE — Progress Notes (Signed)
[facility] called for biomed to come check pts home CPAP.

## 2012-02-13 NOTE — Progress Notes (Signed)
Pt already placed on home CPAP and is tolerating well.  Pt encouraged to notify RN/ RT of any concerns.

## 2012-02-13 NOTE — ED Notes (Signed)
Pt provided PO liquids

## 2012-02-13 NOTE — ED Notes (Signed)
Pt transported to xray 

## 2012-02-13 NOTE — ED Notes (Signed)
MD at bedside. 

## 2012-02-13 NOTE — ED Provider Notes (Signed)
History     CSN: [id number]  Arrival date & time 02/13/12  0043   First MD Initiated Contact with Patient 02/13/12 0129      Chief Complaint  Patient presents with  . Knee Pain    (Consider location/radiation/quality/duration/timing/severity/associated sxs/prior treatment) HPI 41 year old male with pain in his right knee suggest the morning. The pain has come on gradually and is associated with erythema and tenderness. The pain is moderate to severe, worse with movement of the knee or palpation of the patella. It has been associated with chills and fever. He has a history of similar symptoms in the past (which was diagnosed as prepatellar cellulitis per old records).  Past Medical History  Diagnosis Date  . Cellulitis 2003    R knee  . Hypertension   . Thyroid disease     Past Surgical History  Procedure Date  . Thyroid surgery     No family history on file.  History  Substance Use Topics  . Smoking status: Never Smoker   . Smokeless tobacco: Not on file  . Alcohol Use: No      Review of Systems  All other systems reviewed and are negative.    Allergies  Review of patient's allergies indicates no known allergies.  Home Medications  No current outpatient prescriptions on file.  BP 99/71  Pulse 113  Temp(Src) 102.5 F (39.2 C) (Rectal)  Resp 22  Ht 6' (1.829 m)  Wt 270 lb (122.471 kg)  BMI 36.62 kg/m2  SpO2 100%  Physical Exam General: Well-developed, well-nourished male in no acute distress; appearance consistent with age of record HENT: normocephalic, atraumatic Eyes: pupils equal round and reactive to light; extraocular muscles intact Neck: supple Heart: regular rate and rhythm; tachycardia Lungs: clear to auscultation bilaterally Abdomen: soft; nondistended Extremities: No deformity; full range of motion; pulses normal; trace edema of lower legs; erythema of right knee with tenderness to palpation or percussion of patella Neurologic: Awake,  alert and oriented; motor function intact in all extremities and symmetric; no facial droop Skin: Warm and dry Psychiatric: Normal mood and affect   ED Course  Procedures (including critical care time)     MDM   Nursing notes and vitals signs, including pulse oximetry, reviewed.  Summary of this visit's results, reviewed by myself:  Labs:  Results for orders placed during the hospital encounter of 02/13/12  CBC      Component Value Range   WBC 10.5  4.0 - 10.5 (K/uL)   RBC 5.00  4.22 - 5.81 (MIL/uL)   Hemoglobin 14.4  13.0 - 17.0 (g/dL)   HCT 36.6  44.0 - 34.7 (%)   MCV 81.8  78.0 - 100.0 (fL)   MCH 28.8  26.0 - 34.0 (pg)   MCHC 35.2  30.0 - 36.0 (g/dL)   RDW 42.5  95.6 - 38.7 (%)   Platelets 165  150 - 400 (K/uL)  DIFFERENTIAL      Component Value Range   Neutrophils Relative 85 (*) 43 - 77 (%)   Neutro Abs 8.9 (*) 1.7 - 7.7 (K/uL)   Lymphocytes Relative 8 (*) 12 - 46 (%)   Lymphs Abs 0.8  0.7 - 4.0 (K/uL)   Monocytes Relative 7  3 - 12 (%)   Monocytes Absolute 0.7  0.1 - 1.0 (K/uL)   Eosinophils Relative 1  0 - 5 (%)   Eosinophils Absolute 0.1  0.0 - 0.7 (K/uL)   Basophils Relative 0  0 - 1 (%)  Basophils Absolute 0.0  0.0 - 0.1 (K/uL)  BASIC METABOLIC PANEL      Component Value Range   Sodium 134 (*) 135 - 145 (mEq/L)   Potassium 3.4 (*) 3.5 - 5.1 (mEq/L)   Chloride 98  96 - 112 (mEq/L)   CO2 23  19 - 32 (mEq/L)   Glucose, Bld 188 (*) 70 - 99 (mg/dL)   BUN 16  6 - 23 (mg/dL)   Creatinine, Ser 4.09  0.50 - 1.35 (mg/dL)   Calcium 9.2  8.4 - 81.1 (mg/dL)   GFR calc non Af Amer 81 (*) >90 (mL/min)   GFR calc Af Amer >90  >90 (mL/min)    Imaging Studies: Dg Knee 2 Views Right  02/13/2012  *RADIOLOGY REPORT*  Clinical Data: Knee pain  RIGHT KNEE - 1-2 VIEW  Comparison: None.  Findings: No significant effusion.  No evidence of acute fracture or subluxation of the right knee.  Bony exostosis arising from the medial tibial metaphysis.  Bone cortex and trabecular  architecture appear intact.  No bone destruction.  IMPRESSION: Exostosis arising from the medial tibial metaphysis.  No acute fractures.  Original Report Authenticated By: Marlon Pel, M.D.            Hanley Seamen, MD 02/13/12 1236

## 2012-02-13 NOTE — ED Notes (Signed)
Lab bedside.

## 2012-02-13 NOTE — ED Notes (Signed)
Attempt x1 to call report to 3E states RN will call back. 

## 2012-02-13 NOTE — H&P (Addendum)
PCP:   No primary provider on file.   Chief Complaint:  RLE and knee pain, erythema and swelling  HPI: 41 y/o male with pmh of HTN, overweight, GERD; remote R MSSA pre-patellar cellulitis(9-10 years ago) and hx of thyroid disease; came to the hospital complaining of right knee and RLE pain, erythema and mild swelling. Also reports some chills. Symptoms started about 24-36 hours prior to admission. Denies CP, SOB, nausea, vomiting, abdominal pain or any other complaints. In the Ed patient with high fever(up to 102.5); also with mild hypokalemia. [facility] called to admit patient for further evaluation and treatment due to extension of cellulitis and high fever.  Allergies:  No Known Allergies    Past Medical History  Diagnosis Date  . Cellulitis 2003    R knee  . Hypertension   . Thyroid disease     Past Surgical History  Procedure Date  . Thyroid surgery     Prior to Admission medications   Medication Sig Start Date End Date Taking? Authorizing Provider  amLODipine (NORVASC) 5 MG tablet Take 5 mg by mouth daily.   Yes Historical Provider, MD  lisinopril-hydrochlorothiazide (PRINZIDE,ZESTORETIC) 20-25 MG per tablet Take 1 tablet by mouth daily.   Yes Historical Provider, MD  pantoprazole (PROTONIX) 40 MG tablet Take 40 mg by mouth daily.   Yes Historical Provider, MD    Social History:  reports that he has never smoked. He does not have any smokeless tobacco history on file. He reports that he does not drink alcohol or use illicit drugs.  History reviewed. No pertinent family history.  Review of Systems:  Negative except as mentioned on HPI.   Physical Exam: Blood pressure 113/74, pulse 89, temperature 98.3 F (36.8 C), temperature source Oral, resp. rate 18, height 6' (1.829 m), weight 122.8 kg (270 lb 11.6 oz), SpO2 98.00%. General: Well-developed, overweight, well hydrated; in no acute distress. HENT: normocephalic, atraumatic  Eyes: No icterus, EOMI; pupils equal reactive  to light; L pupil larger than right one chronically) Neck: supple  Heart: regular rate and rhythm; no murmurs.  Lungs: clear to auscultation bilaterally  Abdomen: soft; nondistended  Extremities: No deformity; full range of motion; pulses normal; trace edema of RLE, also with erythema of right knee with tenderness to palpation or percussion of patella. Redness extended from above knee until almost his ankle.   Neurologic: Awake, alert and oriented; motor function intact in all extremities and symmetric; no facial droop  Psychiatric: Normal mood and affect   Labs on Admission:  Results for orders placed during the hospital encounter of 02/13/12 (from the past 48 hour(s))  CBC     Status: Normal   Collection Time   02/13/12  1:39 AM      Component Value Range Comment   WBC 10.5  4.0 - 10.5 (K/uL)    RBC 5.00  4.22 - 5.81 (MIL/uL)    Hemoglobin 14.4  13.0 - 17.0 (g/dL)    HCT 40.9  81.1 - 91.4 (%)    MCV 81.8  78.0 - 100.0 (fL)    MCH 28.8  26.0 - 34.0 (pg)    MCHC 35.2  30.0 - 36.0 (g/dL)    RDW 78.2  95.6 - 21.3 (%)    Platelets 165  150 - 400 (K/uL)   DIFFERENTIAL     Status: Abnormal   Collection Time   02/13/12  1:39 AM      Component Value Range Comment   Neutrophils Relative 85 (*) 43 -  77 (%)    Neutro Abs 8.9 (*) 1.7 - 7.7 (K/uL)    Lymphocytes Relative 8 (*) 12 - 46 (%)    Lymphs Abs 0.8  0.7 - 4.0 (K/uL)    Monocytes Relative 7  3 - 12 (%)    Monocytes Absolute 0.7  0.1 - 1.0 (K/uL)    Eosinophils Relative 1  0 - 5 (%)    Eosinophils Absolute 0.1  0.0 - 0.7 (K/uL)    Basophils Relative 0  0 - 1 (%)    Basophils Absolute 0.0  0.0 - 0.1 (K/uL)   BASIC METABOLIC PANEL     Status: Abnormal   Collection Time   02/13/12  1:39 AM      Component Value Range Comment   Sodium 134 (*) 135 - 145 (mEq/L)    Potassium 3.4 (*) 3.5 - 5.1 (mEq/L)    Chloride 98  96 - 112 (mEq/L)    CO2 23  19 - 32 (mEq/L)    Glucose, Bld 188 (*) 70 - 99 (mg/dL)    BUN 16  6 - 23 (mg/dL)     Creatinine, Ser 1.61  0.50 - 1.35 (mg/dL)    Calcium 9.2  8.4 - 10.5 (mg/dL)    GFR calc non Af Amer 81 (*) >90 (mL/min)    GFR calc Af Amer >90  >90 (mL/min)   SEDIMENTATION RATE     Status: Normal   Collection Time   02/13/12  3:29 AM      Component Value Range Comment   Sed Rate 3  0 - 16 (mm/hr)     Radiological Exams on Admission: No results found.  Assessment/Plan 1-RLE pre-patellar Cellulitis: patient denies any injury, animal bites or scratches. Reports been ok until yesterday prior to admission. Now with erythema, rubor and dolor. Had previous hx of MSSA RLE cellulitis in the past and had high fever. Will continue vancomycin, continue physical measures and follow response. Blood cx pending. If LE worsen will get CT or MRI to examine extension of infection.  2-Fever: 2/2 #1; will continue PRN antipyretics and antibiotics. Will follow cx.   3-HTN (hypertension): continue home regimen (amlodipine, lisinopril and HCTZ).  4-Hx of thyroid disease: not sure what type of disease was; will check TSH and Free T4.  5-Overweight (BMI 36): discussion about weight loss and low calorie diet sustained with patient. He is willing to start a weight loss program.  6-Hyperglycemia: no hx of DM; CBG 188. Will check A1C and start modify carb diet and will also start SSI.  7-Hypokalemia: due to HCTZ. Will replete and will check Mg.  8-GERD (gastroesophageal reflux disease): continue protonix.  9-OSA: continue QHS CPAP  10-DVT: lovenox   Time Spent on Admission: 50 minutes  Nazair Fortenberry [facility] [phone]  02/13/2012, 1:35 PM

## 2012-02-14 DIAGNOSIS — L02619 Cutaneous abscess of unspecified foot: Secondary | ICD-10-CM

## 2012-02-14 DIAGNOSIS — R7989 Other specified abnormal findings of blood chemistry: Secondary | ICD-10-CM

## 2012-02-14 DIAGNOSIS — G4710 Hypersomnia, unspecified: Secondary | ICD-10-CM

## 2012-02-14 DIAGNOSIS — G4730 Sleep apnea, unspecified: Secondary | ICD-10-CM

## 2012-02-14 DIAGNOSIS — L03039 Cellulitis of unspecified toe: Secondary | ICD-10-CM

## 2012-02-14 DIAGNOSIS — I10 Essential (primary) hypertension: Secondary | ICD-10-CM

## 2012-02-14 LAB — CBC
HCT: 38.2 % — ABNORMAL LOW (ref 39.0–52.0)
Hemoglobin: 13.2 g/dL (ref 13.0–17.0)
MCH: 28.8 pg (ref 26.0–34.0)
MCHC: 34.6 g/dL (ref 30.0–36.0)

## 2012-02-14 LAB — LIPID PANEL: LDL Cholesterol: 38 mg/dL (ref 0–99)

## 2012-02-14 LAB — TSH: TSH: 3.143 u[IU]/mL (ref 0.350–4.500)

## 2012-02-14 LAB — BASIC METABOLIC PANEL
BUN: 12 mg/dL (ref 6–23)
Calcium: 8.5 mg/dL (ref 8.4–10.5)
Creatinine, Ser: 1.16 mg/dL (ref 0.50–1.35)
GFR calc non Af Amer: 77 mL/min — ABNORMAL LOW (ref >90)
Glucose, Bld: 161 mg/dL — ABNORMAL HIGH (ref 70–99)
Sodium: 134 mEq/L — ABNORMAL LOW (ref 135–145)

## 2012-02-14 LAB — GLUCOSE, CAPILLARY: Glucose-Capillary: 139 mg/dL — ABNORMAL HIGH (ref 70–99)

## 2012-02-14 LAB — HEMOGLOBIN A1C: Mean Plasma Glucose: 148 mg/dL — ABNORMAL HIGH (ref <117)

## 2012-02-14 MED ORDER — VANCOMYCIN HCL 1000 MG IV SOLR
1250.0000 mg | Freq: Two times a day (BID) | INTRAVENOUS | Status: DC
  Administered 2012-02-15 (×2): 1250 mg via INTRAVENOUS
  Filled 2012-02-14 (×2): qty 1250

## 2012-02-14 MED ORDER — LIVING WELL WITH DIABETES BOOK
Freq: Once | Status: AC
  Administered 2012-02-14: 11:00:00
  Filled 2012-02-14: qty 1

## 2012-02-14 MED ORDER — VANCOMYCIN HCL 1000 MG IV SOLR
2000.0000 mg | INTRAVENOUS | Status: AC
  Administered 2012-02-14: 2000 mg via INTRAVENOUS
  Filled 2012-02-14: qty 2000

## 2012-02-14 MED ORDER — VANCOMYCIN HCL IN DEXTROSE 1-5 GM/200ML-% IV SOLN
1000.0000 mg | INTRAVENOUS | Status: DC
  Filled 2012-02-14: qty 200

## 2012-02-14 NOTE — Progress Notes (Signed)
ANTIBIOTIC CONSULT NOTE - INITIAL  Pharmacy Consult for Vancomycin Indication: Fever, Cellulitis RLE  No Known Allergies  Patient Measurements: Height: 6' (182.9 cm) Weight: 270 lb 11.6 oz (122.8 kg) IBW/kg (Calculated) : 77.6   Vital Signs: Temp: 98 F (36.7 C) (05/13 0459) Temp src: Oral (05/13 0459) BP: 111/72 mmHg (05/13 0459) Pulse Rate: 84  (05/13 0459) Intake/Output from previous day: 05/12 0701 - 05/13 0700 In: 1400 [P.O.:1400] Out: 1200 [Urine:1200] Intake/Output from this shift:    Labs:  Basename 02/14/12 0335 02/13/12 0139  WBC 8.1 10.5  HGB 13.2 14.4  PLT 127* 165  LABCREA -- --  CREATININE 1.16 1.11   Estimated Creatinine Clearance: 113.4 ml/min (by C-G formula based on Cr of 1.16).    Microbiology: Recent Results (from the past 720 hour(s))  CULTURE, BLOOD (ROUTINE X 2)     Status: Normal (Preliminary result)   Collection Time   02/13/12  2:56 AM      Component Value Range Status Comment   Specimen Description BLOOD RIGHT ARM   Final    Special Requests BOTTLES DRAWN AEROBIC ONLY [facility]   Final    Culture  Setup Time [id number]   Final    Culture     Final    Value:        BLOOD CULTURE RECEIVED NO GROWTH TO DATE CULTURE WILL BE HELD FOR 5 DAYS BEFORE ISSUING A FINAL NEGATIVE REPORT   Report Status PENDING   Incomplete   CULTURE, BLOOD (ROUTINE X 2)     Status: Normal (Preliminary result)   Collection Time   02/13/12  3:05 AM      Component Value Range Status Comment   Specimen Description BLOOD RIGHT HAND   Final    Special Requests BOTTLES DRAWN AEROBIC AND ANAEROBIC [facility]   Final    Culture  Setup Time [id number]   Final    Culture     Final    Value:        BLOOD CULTURE RECEIVED NO GROWTH TO DATE CULTURE WILL BE HELD FOR 5 DAYS BEFORE ISSUING A FINAL NEGATIVE REPORT   Report Status PENDING   Incomplete     Medical History: Past Medical History  Diagnosis Date  . Cellulitis 2003    R knee  . Hypertension   . Thyroid disease      Medications:  Anti-infectives     Start     Dose/Rate Route Frequency Ordered Stop   02/14/12 1200   vancomycin (VANCOCIN) IVPB 1000 mg/200 mL premix  Status:  Discontinued        1,000 mg 200 mL/hr over 60 Minutes Intravenous Every 24 hours 02/14/12 1111 02/14/12 1139   02/14/12 1145   vancomycin (VANCOCIN) 2,000 mg in sodium chloride 0.9 % 500 mL IVPB        2,000 mg 250 mL/hr over 120 Minutes Intravenous STAT 02/14/12 1141 02/15/12 1145   02/13/12 0530   vancomycin (VANCOCIN) IVPB 1000 mg/200 mL premix        1,000 mg 200 mL/hr over 60 Minutes Intravenous  Once 02/13/12 0509 02/13/12 0641         Assessment:  Kirk Lewis admitted 5/12 w knee pain. Vanc 1g IV x 1 dose in ER given 5/12 ~ 0600.  RLE erythema worse and continues to be tender  Vancomycin ordered to continue per pharmacy today  Pt is obese. BMI 36.7. Scr 1.16, CrCL 56ml/min/1.73m2  WBC wnl, Tm 99.3 (Tc 98), BCx x  2 = NGTD  Goal of Therapy:  Vancomycin trough level 15-20 mcg/ml  Plan:   Vancomycin 2g IV x 1 loading dose  Vancomycin 1250 mg IV q12h maintenance dose  Follow labs, vitals and cultures  Vancomycin trough at steady state  Adjust dose as necessary.  Gwen Her PharmD  [phone] 02/14/2012 11:46 AM

## 2012-02-14 NOTE — Progress Notes (Signed)
Inpatient Diabetes Program Recommendations  AACE/ADA: New Consensus Statement on Inpatient Glycemic Control (2009)  Target Ranges:  Prepandial:   less than 140 mg/dL      Peak postprandial:   less than 180 mg/dL (1-2 hours)      Critically ill patients:  140 - 180 mg/dL   Reason for Visit: Hgb A1c >6.5  Inpatient Diabetes Program Recommendations HgbA1C: Note Hgb A1C of 6.8 which can be diagnostic of diabetes  Note: If patient has diagnosis of diabetes, will need Dietitian Consult, Staff Nurses will need to provide basic diabetes education, and Outpatient Diabetes Education follow-up is recommended. Kirk Lewis S. Kirk Lincoln, RN, CNS, CDE  [phone])

## 2012-02-14 NOTE — Progress Notes (Signed)
Subjective: No high fever; no CP or SOB. RLE still tender and with worsening erythema.  Objective: Vital signs in last 24 hours: Temp:  [98 F (36.7 C)-99.3 F (37.4 C)] 98 F (36.7 C) (05/13 0459) Pulse Rate:  [84-101] 84  (05/13 0459) Resp:  [16-19] 16  (05/13 0459) BP: (110-130)/(72-80) 111/72 mmHg (05/13 0459) SpO2:  [95 %-98 %] 95 % (05/13 0459) Weight change: 0.329 kg (11.6 oz) Last BM Date: 02/12/12  Intake/Output from previous day: 05/12 0701 - 05/13 0700 In: 1400 [P.O.:1400] Out: 1200 [Urine:1200]     Physical Exam: General: Alert, awake, oriented x3, in no acute distress. HEENT: No bruits, no goiter. Heart: Regular rate and rhythm, without murmurs, rubs, gallops. Lungs: Clear to auscultation bilaterally. Abdomen: Soft, nontender, nondistended, positive bowel sounds. Extremities: No clubbing or cyanosis; trace edema and worsening erythema on his RLE; also tender to palpation. Neuro: Grossly intact, nonfocal.   Lab Results: Basic Metabolic Panel:  Basename 02/14/12 0335 02/13/12 1505 02/13/12 0139  NA 134* -- 134*  K 3.8 -- 3.4*  CL 101 -- 98  CO2 24 -- 23  GLUCOSE 161* -- 188*  BUN 12 -- 16  CREATININE 1.16 -- 1.11  CALCIUM 8.5 -- 9.2  MG -- 1.8 --  PHOS -- -- --   CBC:  Basename 02/14/12 0335 02/13/12 0139  WBC 8.1 10.5  NEUTROABS -- 8.9*  HGB 13.2 14.4  HCT 38.2* 40.9  MCV 83.4 81.8  PLT 127* 165   CBG:  Basename 02/14/12 0816 02/13/12 1716  GLUCAP 147* 153*   Hemoglobin A1C:  Basename 02/13/12 1505  HGBA1C 6.8*   Fasting Lipid Panel:  Basename 02/14/12 0335  CHOL 97  HDL 20*  LDLCALC 38  TRIG 161*  CHOLHDL 4.9  LDLDIRECT --   Thyroid Function Tests:  Basename 02/13/12 1505  TSH 2.970  T4TOTAL --  FREET4 0.93  T3FREE --  THYROIDAB --   Urine Drug Screen: Drugs of Abuse     Component Value Date/Time   LABOPIA NONE DETECTED 03/18/2009 0259   COCAINSCRNUR NONE DETECTED 03/18/2009 0259   LABBENZ NONE DETECTED 03/18/2009  0259   AMPHETMU NONE DETECTED 03/18/2009 0259   THCU NONE DETECTED 03/18/2009 0259   LABBARB  Value: NONE DETECTED        DRUG SCREEN FOR MEDICAL PURPOSES ONLY.  IF CONFIRMATION IS NEEDED FOR ANY PURPOSE, NOTIFY LAB WITHIN 5 DAYS.        LOWEST DETECTABLE LIMITS FOR URINE DRUG SCREEN Drug Class       Cutoff (ng/mL) Amphetamine      1000 Barbiturate      200 Benzodiazepine   200 Tricyclics       300 Opiates          300 Cocaine          300 THC              50 03/18/2009 0259   Misc. Labs:  Recent Results (from the past 240 hour(s))  CULTURE, BLOOD (ROUTINE X 2)     Status: Normal (Preliminary result)   Collection Time   02/13/12  2:56 AM      Component Value Range Status Comment   Specimen Description BLOOD RIGHT ARM   Final    Special Requests BOTTLES DRAWN AEROBIC ONLY [facility]   Final    Culture  Setup Time [id number]   Final    Culture     Final    Value:  BLOOD CULTURE RECEIVED NO GROWTH TO DATE CULTURE WILL BE HELD FOR 5 DAYS BEFORE ISSUING A FINAL NEGATIVE REPORT   Report Status PENDING   Incomplete   CULTURE, BLOOD (ROUTINE X 2)     Status: Normal (Preliminary result)   Collection Time   02/13/12  3:05 AM      Component Value Range Status Comment   Specimen Description BLOOD RIGHT HAND   Final    Special Requests BOTTLES DRAWN AEROBIC AND ANAEROBIC [facility]   Final    Culture  Setup Time [id number]   Final    Culture     Final    Value:        BLOOD CULTURE RECEIVED NO GROWTH TO DATE CULTURE WILL BE HELD FOR 5 DAYS BEFORE ISSUING A FINAL NEGATIVE REPORT   Report Status PENDING   Incomplete     Studies/Results: Dg Knee 2 Views Right  02/13/2012  *RADIOLOGY REPORT*  Clinical Data: Knee pain  RIGHT KNEE - 1-2 VIEW  Comparison: None.  Findings: No significant effusion.  No evidence of acute fracture or subluxation of the right knee.  Bony exostosis arising from the medial tibial metaphysis.  Bone cortex and trabecular architecture appear intact.  No bone destruction.  IMPRESSION:  Exostosis arising from the medial tibial metaphysis.  No acute fractures.  Original Report Authenticated By: Marlon Pel, M.D.    Medications: Scheduled Meds:   . amLODipine  5 mg Oral Daily  . enoxaparin (LOVENOX) injection  60 mg Subcutaneous Q24H  . hydrochlorothiazide  25 mg Oral Daily  . insulin aspart  0-9 Units Subcutaneous TID WC  . lisinopril  20 mg Oral Daily  . living well with diabetes book   Does not apply Once  . pantoprazole  40 mg Oral Q1200  . potassium chloride  40 mEq Oral Q4H  . vancomycin  1,000 mg Intravenous Q24H  . DISCONTD: enoxaparin  40 mg Subcutaneous Q24H  . DISCONTD: lisinopril-hydrochlorothiazide  1 tablet Oral Daily   Continuous Infusions:   . sodium chloride 75 mL/hr at 02/13/12 2048  . DISCONTD: sodium chloride 750 mL/hr (02/13/12 0353)   PRN Meds:.acetaminophen, ibuprofen, morphine injection, ondansetron (ZOFRAN) IV, ondansetron, oxyCODONE  Assessment/Plan: 1-RLE pre-patellar Cellulitis: patient denies any injury, animal bites or scratches. Reports still significant pain; but endorses mild improving. Erythema is worse (as expected); no high fevers. Blood cx negative up to date. If LE worsen will get CT or MRI to examine extension of infection. Continue vancomycin one more day; if he continue improving home tomorrow on PO antibiotics.  2-Fever: 2/2 #1; will continue PRN antipyretics and antibiotics. Will follow cx.   3-HTN (hypertension): continue home regimen (amlodipine, lisinopril and HCTZ).   4-Hx of thyroid disease: Per patient removal of benign nodule. Normal free T4; TSH pending.   5-Overweight (BMI 36): discussion about weight loss, exercise  and low calorie diet sustained with patient. He is willing to start a weight loss program. Will get dietician consult.   6-Hyperglycemia: no hx of DM; CBG 188. Will check A1C 6.8. Will get dietician consult; continue SSI and start on metformin at discharge.  7-Hypokalemia: due to HCTZ.  Repleted. Mg WNL.  8-GERD (gastroesophageal reflux disease): continue protonix.   9-OSA: continue QHS CPAP   10-DVT: lovenox     LOS: 1 day   Tekoa Amon [facility] [phone]  02/14/2012, 11:13 AM

## 2012-02-15 DIAGNOSIS — L02619 Cutaneous abscess of unspecified foot: Secondary | ICD-10-CM

## 2012-02-15 DIAGNOSIS — L03039 Cellulitis of unspecified toe: Secondary | ICD-10-CM

## 2012-02-15 DIAGNOSIS — R7989 Other specified abnormal findings of blood chemistry: Secondary | ICD-10-CM

## 2012-02-15 DIAGNOSIS — G4710 Hypersomnia, unspecified: Secondary | ICD-10-CM

## 2012-02-15 DIAGNOSIS — G4730 Sleep apnea, unspecified: Secondary | ICD-10-CM

## 2012-02-15 DIAGNOSIS — I10 Essential (primary) hypertension: Secondary | ICD-10-CM

## 2012-02-15 LAB — GLUCOSE, CAPILLARY
Glucose-Capillary: 131 mg/dL — ABNORMAL HIGH (ref 70–99)
Glucose-Capillary: 151 mg/dL — ABNORMAL HIGH (ref 70–99)

## 2012-02-15 MED ORDER — DOXYCYCLINE HYCLATE 100 MG PO TABS: 100.0000 mg | ORAL_TABLET | Freq: Two times a day (BID) | ORAL | Status: AC

## 2012-02-15 MED ORDER — LIVING WELL WITH DIABETES BOOK
Freq: Once | Status: DC
  Filled 2012-02-15: qty 1

## 2012-02-15 MED ORDER — OXYCODONE HCL 5 MG PO TABS: 5.0000 mg | ORAL_TABLET | Freq: Four times a day (QID) | ORAL | Status: AC | PRN

## 2012-02-15 MED ORDER — METFORMIN HCL 500 MG PO TABS: 500.0000 mg | ORAL_TABLET | Freq: Two times a day (BID) | ORAL | Status: DC

## 2012-02-15 NOTE — Discharge Summary (Signed)
Physician Discharge Summary  Patient ID: Kirk Lewis MRN: [id number] DOB/AGE: 1970/11/13 41 y.o.  Admit date: 02/26/12 Discharge date: 02/15/2012  Primary Care Physician:  No primary provider on file.   Discharge Diagnoses:   .Cellulitis .Fever .HTN (hypertension) .Hx of thyroid disease .Overweight .Diabetes (A1C 6.8; fasting CBG's in the 160-180 range) .Hypokalemia .GERD (gastroesophageal reflux disease)  Medication List  As of 02/15/2012  2:51 PM   TAKE these medications         amLODipine 5 MG tablet   Commonly known as: NORVASC   Take 5 mg by mouth daily.      doxycycline 100 MG tablet   Commonly known as: VIBRA-TABS   Take 1 tablet (100 mg total) by mouth 2 (two) times daily.      lisinopril-hydrochlorothiazide 20-25 MG per tablet   Commonly known as: PRINZIDE,ZESTORETIC   Take 1 tablet by mouth daily.      metFORMIN 500 MG tablet   Commonly known as: GLUCOPHAGE   Take 1 tablet (500 mg total) by mouth 2 (two) times daily with a meal.      oxyCODONE 5 MG immediate release tablet   Commonly known as: Oxy IR/ROXICODONE   Take 1 tablet (5 mg total) by mouth every 6 (six) hours as needed.      pantoprazole 40 MG tablet   Commonly known as: PROTONIX   Take 40 mg by mouth daily.             Disposition and Follow-up:  Patient discharge in stable condition and with instructions to finish 10 days of twice a day doxycyline. He will keep himself hydrated and will follow discharge instructions. Patient with elevated CBG and A1C during admission; advised to follow low carb diet and to start metformin BId.  Consults:  Diabetes coordinator and dietician.   Significant Diagnostic Studies:  Dg Knee 2 Views Right  Feb 26, 2012  *RADIOLOGY REPORT*  Clinical Data: Knee pain  RIGHT KNEE - 1-2 VIEW  Comparison: None.  Findings: No significant effusion.  No evidence of acute fracture or subluxation of the right knee.  Bony exostosis arising from the medial tibial  metaphysis.  Bone cortex and trabecular architecture appear intact.  No bone destruction.  IMPRESSION: Exostosis arising from the medial tibial metaphysis.  No acute fractures.  Original Report Authenticated By: Marlon Pel, M.D.    Brief H and P: 41 y/o male with pmh of HTN, overweight, GERD; remote R MSSA pre-patellar cellulitis(9-10 years ago) and hx of thyroid disease; came to the hospital complaining of right knee and RLE pain, erythema and mild swelling. Also reports some chills. Symptoms started about 24-36 hours prior to admission. Denies CP, SOB, nausea, vomiting, abdominal pain or any other complaints.  In the Ed patient with high fever(up to 102.5); also with mild hypokalemia. [facility] called to admit patient for further evaluation and treatment due to extension of cellulitis and high fever.   Hospital Course:  1-RLE pre-patellar Cellulitis: patient denies any injury, animal bites or scratches. Reports still experiencing some pain when bearing weight; but now w/o fever, WBC's WNL, erythema resolving and no further warm sensation on his leg. Blood cx negative up to date. Will d/c home on doxycycline to finish 10 more days of antibiotics.   2-Fever: 2/2 #1; No further fever since antibiotics started. Blood cx negative. Continue tx as instructed for cellulitis   3-HTN (hypertension): continue home regimen (amlodipine, lisinopril and HCTZ).   4-Hx of thyroid disease: Per patient removal  of benign nodule. Normal free T4; TSH WNL.   5-Overweight (BMI 36): discussion about weight loss, exercise and low calorie diet sustained with patient. He is willing to start a weight loss program. Will get dietician consult.   6-Diabetes: CBG in the 160-188. A1C 6.8. Patient advised to follow a low crab diet and was started on metformin BID. Will follow with PCP in 2 weeks for further follow up.   7-Hypokalemia: due to HCTZ. Repleted. Mg WNL.   8-GERD (gastroesophageal reflux disease): continue  protonix.   9-OSA: continue QHS CPAP    Time spent on Discharge: 40 minutes Signed: [name] 02/15/2012, 2:51 PM

## 2012-02-15 NOTE — Plan of Care (Signed)
Problem: Food- and Nutrition-Related Knowledge Deficit (NB-1.1) Goal: Nutrition education Formal process to instruct or train a patient/client in a skill or to impart knowledge to help patients/clients voluntarily manage or modify food choices and eating behavior to maintain or improve health.  Outcome: Completed/Met Date Met:  02/15/12 Pt reports his bad eating habits have worsened in the past few months and that pt has been drinking more sodas and eating out for every meal. Pt states it's hard for him to avoid soda as he works for [employer], but he has been trying to drink more water. Pt states he and his girlfriend having been talking about the importance of taking better care of his health and that he doesn't want to end up like his mom who died from DM, but "did it to herself" per pt report. Pt states he is hungriest in the afternoon - discussed healthy snacks he could eat at that time. Pt reports he spends most of his time in his truck and he has been snacking on almonds recently. Discussed healthy options for eating out and the importance of planning meals throughout the week on the weekend. Discussed sources of CHO in diet and recommend portion sizes. Provided handouts of healthy eating/weight loss tips and RD contact information.   Recommend MD order referral for [facility] or similar outpatient program to meet with an outpatient RD to reinforce healthy eating principles

## 2012-02-15 NOTE — Discharge Instructions (Signed)

## 2012-02-15 NOTE — Progress Notes (Signed)
Spoke briefly with patient prior to discharge home.  He had "Living well with Diabetes" booklet at his bedside.  Briefly explained Type 2 diabetes, and lifestyle modifications.  He has PCP, Dr. Renne Crigler and plans to follow-up with him.  Briefly discussed exercise, diet, and diabetes survival skills.  Patient seems motivated to learn more and states he has a friend with diabetes (who is also a Engineer, civil (consulting)) which can help support him.  Seems motivated to improve health and decrease risk factors.

## 2012-02-19 LAB — CULTURE, BLOOD (ROUTINE X 2): Culture  Setup Time: 201305121052

## 2012-08-23 ENCOUNTER — Other Ambulatory Visit: Payer: Self-pay | Admitting: Internal Medicine

## 2012-08-23 DIAGNOSIS — R911 Solitary pulmonary nodule: Secondary | ICD-10-CM

## 2014-01-07 ENCOUNTER — Other Ambulatory Visit: Payer: Self-pay | Admitting: Internal Medicine

## 2014-01-07 DIAGNOSIS — C73 Malignant neoplasm of thyroid gland: Secondary | ICD-10-CM

## 2014-01-07 DIAGNOSIS — R911 Solitary pulmonary nodule: Secondary | ICD-10-CM

## 2014-01-14 ENCOUNTER — Inpatient Hospital Stay: Admission: RE | Admit: 2014-01-14 | Payer: Managed Care, Other (non HMO) | Source: Ambulatory Visit

## 2014-01-14 ENCOUNTER — Ambulatory Visit
Admission: RE | Admit: 2014-01-14 | Discharge: 2014-01-14 | Disposition: A | Discharge status: Home / Self Care | Payer: Managed Care, Other (non HMO) | Source: Ambulatory Visit | Attending: Internal Medicine | Admitting: Internal Medicine

## 2014-01-14 DIAGNOSIS — C73 Malignant neoplasm of thyroid gland: Secondary | ICD-10-CM

## 2014-01-18 ENCOUNTER — Ambulatory Visit
Admission: RE | Admit: 2014-01-18 | Discharge: 2014-01-18 | Disposition: A | Discharge status: Home / Self Care | Payer: Managed Care, Other (non HMO) | Source: Ambulatory Visit | Attending: Internal Medicine | Admitting: Internal Medicine

## 2014-01-18 DIAGNOSIS — C73 Malignant neoplasm of thyroid gland: Secondary | ICD-10-CM

## 2014-01-18 DIAGNOSIS — R911 Solitary pulmonary nodule: Secondary | ICD-10-CM

## 2014-03-13 ENCOUNTER — Other Ambulatory Visit: Payer: Self-pay | Admitting: Endocrinology

## 2014-03-13 DIAGNOSIS — E041 Nontoxic single thyroid nodule: Secondary | ICD-10-CM

## 2014-06-10 ENCOUNTER — Encounter (HOSPITAL_COMMUNITY): Payer: Self-pay | Admitting: Emergency Medicine

## 2014-06-10 ENCOUNTER — Emergency Department (HOSPITAL_COMMUNITY): Payer: Managed Care, Other (non HMO)

## 2014-06-10 ENCOUNTER — Emergency Department (HOSPITAL_COMMUNITY)
Admission: EM | Admit: 2014-06-10 | Discharge: 2014-06-11 | Disposition: A | Discharge status: Home / Self Care | Payer: Managed Care, Other (non HMO) | Attending: Emergency Medicine | Admitting: Emergency Medicine

## 2014-06-10 DIAGNOSIS — IMO0002 Reserved for concepts with insufficient information to code with codable children: Secondary | ICD-10-CM | POA: Diagnosis not present

## 2014-06-10 DIAGNOSIS — Z862 Personal history of diseases of the blood and blood-forming organs and certain disorders involving the immune mechanism: Secondary | ICD-10-CM | POA: Insufficient documentation

## 2014-06-10 DIAGNOSIS — Z872 Personal history of diseases of the skin and subcutaneous tissue: Secondary | ICD-10-CM | POA: Diagnosis not present

## 2014-06-10 DIAGNOSIS — I2510 Atherosclerotic heart disease of native coronary artery without angina pectoris: Secondary | ICD-10-CM | POA: Diagnosis not present

## 2014-06-10 DIAGNOSIS — Z8639 Personal history of other endocrine, nutritional and metabolic disease: Secondary | ICD-10-CM | POA: Insufficient documentation

## 2014-06-10 DIAGNOSIS — S4980XA Other specified injuries of shoulder and upper arm, unspecified arm, initial encounter: Secondary | ICD-10-CM | POA: Diagnosis not present

## 2014-06-10 DIAGNOSIS — S46909A Unspecified injury of unspecified muscle, fascia and tendon at shoulder and upper arm level, unspecified arm, initial encounter: Secondary | ICD-10-CM | POA: Insufficient documentation

## 2014-06-10 DIAGNOSIS — Z79899 Other long term (current) drug therapy: Secondary | ICD-10-CM | POA: Diagnosis not present

## 2014-06-10 DIAGNOSIS — Y92410 Unspecified street and highway as the place of occurrence of the external cause: Secondary | ICD-10-CM | POA: Diagnosis not present

## 2014-06-10 DIAGNOSIS — S0993XA Unspecified injury of face, initial encounter: Secondary | ICD-10-CM | POA: Diagnosis not present

## 2014-06-10 DIAGNOSIS — Y9389 Activity, other specified: Secondary | ICD-10-CM | POA: Insufficient documentation

## 2014-06-10 DIAGNOSIS — I10 Essential (primary) hypertension: Secondary | ICD-10-CM | POA: Diagnosis not present

## 2014-06-10 DIAGNOSIS — S199XXA Unspecified injury of neck, initial encounter: Principal | ICD-10-CM

## 2014-06-10 HISTORY — DX: Atherosclerotic heart disease of native coronary artery without angina pectoris: I25.10

## 2014-06-10 MED ORDER — HYDROMORPHONE HCL PF 1 MG/ML IJ SOLN
1.0000 mg | Freq: Once | INTRAMUSCULAR | Status: AC
  Administered 2014-06-10: 1 mg via INTRAMUSCULAR
  Filled 2014-06-10: qty 1
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
## 2014-06-11 DIAGNOSIS — S0993XA Unspecified injury of face, initial encounter: Secondary | ICD-10-CM | POA: Diagnosis not present

## 2014-06-11 DIAGNOSIS — S199XXA Unspecified injury of neck, initial encounter: Secondary | ICD-10-CM | POA: Diagnosis not present

## 2014-06-11 MED ORDER — OXYCODONE-ACETAMINOPHEN 5-325 MG PO TABS
1.0000 | ORAL_TABLET | Freq: Once | ORAL | Status: AC
  Administered 2014-06-11: 1 via ORAL
  Filled 2014-06-11: qty 1

## 2014-06-11 MED ORDER — HYDROCODONE-ACETAMINOPHEN 5-325 MG PO TABS: 1.0000 | ORAL_TABLET | Freq: Four times a day (QID) | ORAL | Status: DC | PRN

## 2014-06-11 MED ORDER — TRAMADOL HCL 50 MG PO TABS: 50.0000 mg | ORAL_TABLET | Freq: Four times a day (QID) | ORAL | Status: DC | PRN

## 2014-06-11 MED ORDER — DIAZEPAM 5 MG PO TABS: 5.0000 mg | ORAL_TABLET | Freq: Two times a day (BID) | ORAL | Status: AC

## 2014-06-11 MED ORDER — IBUPROFEN 800 MG PO TABS: 800.0000 mg | ORAL_TABLET | Freq: Three times a day (TID) | ORAL | Status: DC

## 2014-06-11 MED ORDER — DIAZEPAM 5 MG PO TABS: 5.0000 mg | ORAL_TABLET | Freq: Two times a day (BID) | ORAL | Status: DC

## 2014-06-11 MED ORDER — IBUPROFEN 800 MG PO TABS: 800.0000 mg | ORAL_TABLET | Freq: Three times a day (TID) | ORAL | Status: AC

## 2014-06-11 NOTE — ED Notes (Signed)
Family at bedside. 

## 2014-06-11 NOTE — ED Notes (Addendum)
C-Collar removed per RN-James.
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
## 2014-09-12 ENCOUNTER — Other Ambulatory Visit: Payer: Managed Care, Other (non HMO)

## 2014-10-10 ENCOUNTER — Other Ambulatory Visit: Payer: Managed Care, Other (non HMO)

## 2014-10-17 ENCOUNTER — Ambulatory Visit
Admission: RE | Admit: 2014-10-17 | Discharge: 2014-10-17 | Disposition: A | Discharge status: Home / Self Care | Payer: Managed Care, Other (non HMO) | Source: Ambulatory Visit | Attending: Endocrinology | Admitting: Endocrinology

## 2014-10-17 DIAGNOSIS — E041 Nontoxic single thyroid nodule: Secondary | ICD-10-CM

## 2014-11-15 ENCOUNTER — Other Ambulatory Visit: Payer: Self-pay | Admitting: Internal Medicine

## 2014-11-15 DIAGNOSIS — E049 Nontoxic goiter, unspecified: Secondary | ICD-10-CM

## 2014-11-21 ENCOUNTER — Other Ambulatory Visit (HOSPITAL_COMMUNITY)
Admission: RE | Admit: 2014-11-21 | Discharge: 2014-11-21 | Disposition: A | Discharge status: Home / Self Care | Payer: Managed Care, Other (non HMO) | Source: Ambulatory Visit | Attending: Interventional Radiology | Admitting: Interventional Radiology

## 2014-11-21 ENCOUNTER — Ambulatory Visit
Admission: RE | Admit: 2014-11-21 | Discharge: 2014-11-21 | Disposition: A | Discharge status: Home / Self Care | Payer: Managed Care, Other (non HMO) | Source: Ambulatory Visit | Attending: Internal Medicine | Admitting: Internal Medicine

## 2014-11-21 DIAGNOSIS — E041 Nontoxic single thyroid nodule: Secondary | ICD-10-CM | POA: Diagnosis present

## 2014-11-21 DIAGNOSIS — E049 Nontoxic goiter, unspecified: Secondary | ICD-10-CM

## 2014-12-20 ENCOUNTER — Encounter (HOSPITAL_COMMUNITY): Payer: Self-pay | Admitting: *Deleted

## 2014-12-20 ENCOUNTER — Other Ambulatory Visit (HOSPITAL_COMMUNITY): Payer: Self-pay

## 2014-12-20 ENCOUNTER — Emergency Department (HOSPITAL_COMMUNITY): Payer: Managed Care, Other (non HMO)

## 2014-12-20 ENCOUNTER — Observation Stay (HOSPITAL_COMMUNITY)
Admission: EM | Admit: 2014-12-20 | Discharge: 2014-12-22 | Disposition: A | Discharge status: Home / Self Care | Payer: Managed Care, Other (non HMO) | Attending: Internal Medicine | Admitting: Internal Medicine

## 2014-12-20 DIAGNOSIS — M542 Cervicalgia: Secondary | ICD-10-CM | POA: Diagnosis not present

## 2014-12-20 DIAGNOSIS — Z79899 Other long term (current) drug therapy: Secondary | ICD-10-CM | POA: Diagnosis not present

## 2014-12-20 DIAGNOSIS — I10 Essential (primary) hypertension: Secondary | ICD-10-CM | POA: Diagnosis present

## 2014-12-20 DIAGNOSIS — Z7982 Long term (current) use of aspirin: Secondary | ICD-10-CM | POA: Insufficient documentation

## 2014-12-20 DIAGNOSIS — R110 Nausea: Secondary | ICD-10-CM | POA: Insufficient documentation

## 2014-12-20 DIAGNOSIS — R42 Dizziness and giddiness: Secondary | ICD-10-CM | POA: Diagnosis not present

## 2014-12-20 DIAGNOSIS — R079 Chest pain, unspecified: Secondary | ICD-10-CM | POA: Diagnosis not present

## 2014-12-20 DIAGNOSIS — E119 Type 2 diabetes mellitus without complications: Secondary | ICD-10-CM

## 2014-12-20 DIAGNOSIS — I2510 Atherosclerotic heart disease of native coronary artery without angina pectoris: Secondary | ICD-10-CM | POA: Diagnosis present

## 2014-12-20 DIAGNOSIS — E039 Hypothyroidism, unspecified: Secondary | ICD-10-CM | POA: Diagnosis not present

## 2014-12-20 DIAGNOSIS — Z8639 Personal history of other endocrine, nutritional and metabolic disease: Secondary | ICD-10-CM

## 2014-12-20 DIAGNOSIS — R200 Anesthesia of skin: Secondary | ICD-10-CM | POA: Insufficient documentation

## 2014-12-20 DIAGNOSIS — R072 Precordial pain: Secondary | ICD-10-CM

## 2014-12-20 DIAGNOSIS — R0602 Shortness of breath: Secondary | ICD-10-CM | POA: Insufficient documentation

## 2014-12-20 HISTORY — DX: Type 2 diabetes mellitus without complications: E11.9

## 2014-12-20 LAB — BASIC METABOLIC PANEL
ANION GAP: 12 (ref 5–15)
BUN: 14 mg/dL (ref 6–23)
CALCIUM: 8.8 mg/dL (ref 8.4–10.5)
CO2: 22 mmol/L (ref 19–32)
Chloride: 99 mmol/L (ref 96–112)
Creatinine, Ser: 1.12 mg/dL (ref 0.50–1.35)
GFR calc Af Amer: >90 mL/min (ref >90)
GFR calc non Af Amer: 78 mL/min — ABNORMAL LOW (ref >90)
GLUCOSE: 361 mg/dL — AB (ref 70–99)
Potassium: 4.3 mmol/L (ref 3.5–5.1)
SODIUM: 133 mmol/L — AB (ref 135–145)

## 2014-12-20 LAB — CBC
HCT: 40.3 % (ref 39.0–52.0)
Hemoglobin: 14.8 g/dL (ref 13.0–17.0)
MCH: 30.1 pg (ref 26.0–34.0)
MCHC: 36.7 g/dL — ABNORMAL HIGH (ref 30.0–36.0)
MCV: 81.9 fL (ref 78.0–100.0)
Platelets: 174 10*3/uL (ref 150–400)
RBC: 4.92 MIL/uL (ref 4.22–5.81)
RDW: 13.2 % (ref 11.5–15.5)
WBC: 5.9 10*3/uL (ref 4.0–10.5)

## 2014-12-20 LAB — I-STAT TROPONIN, ED: Troponin i, poc: 0 ng/mL (ref 0.00–0.08)

## 2014-12-20 LAB — BRAIN NATRIURETIC PEPTIDE: B Natriuretic Peptide: 8 pg/mL (ref 0.0–100.0)

## 2014-12-20 MED ORDER — SODIUM CHLORIDE 0.9 % IV SOLN
INTRAVENOUS | Status: DC
  Administered 2014-12-21: 01:00:00 via INTRAVENOUS

## 2014-12-20 MED ORDER — ONDANSETRON HCL 4 MG/2ML IJ SOLN
4.0000 mg | Freq: Once | INTRAMUSCULAR | Status: AC
  Administered 2014-12-21: 4 mg via INTRAVENOUS
  Filled 2014-12-20: qty 2

## 2014-12-20 MED ORDER — SODIUM CHLORIDE 0.9 % IV BOLUS (SEPSIS)
500.0000 mL | Freq: Once | INTRAVENOUS | Status: AC
  Administered 2014-12-21: 500 mL via INTRAVENOUS

## 2014-12-20 MED ORDER — MORPHINE SULFATE 2 MG/ML IJ SOLN
2.0000 mg | Freq: Once | INTRAMUSCULAR | Status: AC
  Administered 2014-12-21: 2 mg via INTRAVENOUS
  Filled 2014-12-20: qty 1

## 2014-12-20 NOTE — ED Provider Notes (Addendum)
CSN: [id number]     Arrival date & time 12/20/14  2243 History  This chart was scribed for Fredia Sorrow, MD by Delphia Grates, ED Scribe. This patient was seen in room A01C/A01C and the patient's care was started at 11:16 PM.   Chief Complaint  Patient presents with  . Chest Pain    Patient is a 44 y.o. male presenting with chest pain. The history is provided by the patient. No language interpreter was used.  Chest Pain Pain location:  L chest and R chest Pain quality: pressure   Pain radiates to:  Does not radiate Pain radiates to the back: no   Pain severity:  Moderate Onset quality:  Sudden Duration:  4 hours Timing:  Constant Progression:  Unchanged Chronicity:  New Relieved by:  Nitroglycerin Worsened by:  Nothing tried Ineffective treatments:  Aspirin Associated symptoms: cough and shortness of breath   Associated symptoms: no abdominal pain, no back pain, no dizziness, no fever, no headache, no nausea and not vomiting   Risk factors: coronary artery disease      HPI Comments: Kirk Lewis is a 44 y.o. male, with history of HTN and CAD, who presents to the Emergency Department complaining of constant, non-radiating, bilateral chest pain that began approximately 4 hours ago at 1900 tonight. Patient states he he woke up this morning with numbness to the left arm and face, that lasted approximately 15 minutes, and developed chest pain later in the day. Patient describes the pain as pressure and rates it a 8/10 at is worse and a 3/10 currently. There is associated chills, SOB, dizziness, and intermittent cough. Patient states the pain is worse when breathing. He reports taking 4 baby aspirin prior to arrival of EMS, but is unsure if it had any effect. Patient was given 1 nitro by EMS while en route to the ED. He also mentions the numbness in his left arm has improved, however, the numbness in his left hand has gotten worse. Wife states the patient has intermittent numbness in  the left arm for the past 2-3 weeks. He denies any weakness or speech difficulty. He also reports an increase in his thyroid medication 1-2 weeks ago, unsure if this is related. Patient denies fever, rhinorrhea, sore throat, leg swelling, visual disturbances, abdominal pain, nausea, vomiting, diarrhea, dysuria, back pain, neck pain, rash, dizziness, headache, or light-headedness.     Past Medical History  Diagnosis Date  . Cellulitis 2003    R knee  . Hypertension   . Thyroid disease   . Coronary artery disease   . Diabetes mellitus without complication    Past Surgical History  Procedure Laterality Date  . Thyroid surgery     History reviewed. No pertinent family history. History  Substance Use Topics  . Smoking status: Never Smoker   . Smokeless tobacco: Never Used  . Alcohol Use: No    Review of Systems  Constitutional: Positive for chills. Negative for fever.  HENT: Negative for rhinorrhea and sore throat.   Eyes: Negative for visual disturbance.  Respiratory: Positive for cough and shortness of breath.   Cardiovascular: Positive for chest pain. Negative for leg swelling.  Gastrointestinal: Negative for nausea, vomiting, abdominal pain and diarrhea.  Genitourinary: Negative for dysuria.  Musculoskeletal: Negative for myalgias, back pain and neck pain.  Skin: Negative for rash.  Neurological: Negative for dizziness, light-headedness and headaches.  Hematological: Does not bruise/bleed easily.  Psychiatric/Behavioral: Negative for confusion.      Allergies  Review  of patient's allergies indicates no known allergies.  Home Medications   Prior to Admission medications   Medication Sig Start Date End Date Taking? Authorizing Provider  amLODipine (NORVASC) 5 MG tablet Take 5 mg by mouth daily.   Yes Historical Provider, MD  aspirin EC 81 MG tablet Take 81 mg by mouth daily.   Yes Historical Provider, MD  atorvastatin (LIPITOR) 40 MG tablet Take 20 mg by mouth daily.    Yes Historical Provider, MD  HYDROcodone-acetaminophen (NORCO/VICODIN) 5-325 MG per tablet Take 1 tablet by mouth every 6 (six) hours as needed. Patient taking differently: Take 1 tablet by mouth every 6 (six) hours as needed for moderate pain.  06/11/14  Yes Carmin Muskrat, MD  levothyroxine (SYNTHROID, LEVOTHROID) 75 MCG tablet Take 75 mcg by mouth daily before breakfast.   Yes Historical Provider, MD  lisinopril-hydrochlorothiazide (PRINZIDE,ZESTORETIC) 20-25 MG per tablet Take 1 tablet by mouth daily.   Yes Historical Provider, MD  metFORMIN (GLUCOPHAGE) 500 MG tablet Take 1 tablet (500 mg total) by mouth 2 (two) times daily with a meal. 02/15/12 12/21/14 Yes Barton Dubois, MD  pantoprazole (PROTONIX) 40 MG tablet Take 40 mg by mouth daily.   Yes Historical Provider, MD  Omega-3 Fatty Acids (FISH OIL PO) Take 1 capsule by mouth every other day.    Historical Provider, MD  PRESCRIPTION MEDICATION Take 1 tablet by mouth daily. Thyroid    Historical Provider, MD  PRESCRIPTION MEDICATION Take 1 tablet by mouth daily. cholesterol    Historical Provider, MD   Triage Vitals: BP 120/66 mmHg  Pulse 86  Temp(Src) 98.1 F (36.7 C) (Oral)  Resp 16  SpO2 100%  Physical Exam  Constitutional: He is oriented to person, place, and time. He appears well-developed and well-nourished. No distress.  HENT:  Head: Normocephalic and atraumatic.  Moist mucous membranes.  Eyes: Conjunctivae and EOM are normal.  Pupils normal. Sclera clear. Eyes track normally.  Neck: Neck supple. No tracheal deviation present.  Cardiovascular: Normal rate, regular rhythm and normal heart sounds.   No murmur heard. Pulmonary/Chest: Effort normal and breath sounds normal. No respiratory distress.  Lungs are clear bilaterally  Abdominal: Soft. Bowel sounds are normal. There is no tenderness.  Musculoskeletal: Normal range of motion. He exhibits no edema.  No swelling in the ankles  Neurological: He is alert and oriented to  person, place, and time. No cranial nerve deficit. He exhibits normal muscle tone. Coordination normal.  Skin: Skin is warm and dry.  Psychiatric: He has a normal mood and affect. His behavior is normal.  Nursing note and vitals reviewed.   ED Course  Procedures (including critical care time)  DIAGNOSTIC STUDIES: Oxygen Saturation is 100% on room air, normal by my interpretation.    COORDINATION OF CARE: At 2328 Discussed treatment plan with patient which includes imaging. Patient agrees.   Medications  0.9 %  sodium chloride infusion ( Intravenous New Bag/Given 12/21/14 0030)  HYDROmorphone (DILAUDID) injection 1 mg (not administered)  sodium chloride 0.9 % bolus 500 mL (0 mLs Intravenous Stopped 12/21/14 0029)  ondansetron (ZOFRAN) injection 4 mg (4 mg Intravenous Given 12/21/14 0004)  morphine 2 MG/ML injection 2 mg (2 mg Intravenous Given 12/21/14 0004)  iohexol (OMNIPAQUE) 350 MG/ML injection 75 mL (75 mLs Intravenous Contrast Given 12/21/14 0138)    Results for orders placed or performed during the hospital encounter of 12/20/14  CBC  Result Value Ref Range   WBC 5.9 4.0 - 10.5 K/uL   RBC 4.92  4.22 - 5.81 MIL/uL   Hemoglobin 14.8 13.0 - 17.0 g/dL   HCT 40.3 39.0 - 52.0 %   MCV 81.9 78.0 - 100.0 fL   MCH 30.1 26.0 - 34.0 pg   MCHC 36.7 (H) 30.0 - 36.0 g/dL   RDW 13.2 11.5 - 15.5 %   Platelets 174 150 - 400 K/uL  Basic metabolic panel  Result Value Ref Range   Sodium 133 (L) 135 - 145 mmol/L   Potassium 4.3 3.5 - 5.1 mmol/L   Chloride 99 96 - 112 mmol/L   CO2 22 19 - 32 mmol/L   Glucose, Bld 361 (H) 70 - 99 mg/dL   BUN 14 6 - 23 mg/dL   Creatinine, Ser 1.12 0.50 - 1.35 mg/dL   Calcium 8.8 8.4 - 10.5 mg/dL   GFR calc non Af Amer 78 (L) >90 mL/min   GFR calc Af Amer >90 >90 mL/min   Anion gap 12 5 - 15  BNP (order ONLY if patient complains of dyspnea/SOB AND you have documented it for THIS visit)  Result Value Ref Range   B Natriuretic Peptide 8.0 0.0 - 100.0 pg/mL   I-stat troponin, ED (not at [facility])  Result Value Ref Range   Troponin i, poc 0.00 0.00 - 0.08 ng/mL   Comment 3            Imaging Review Ct Head Wo Contrast  12/21/2014   CLINICAL DATA:  LEFT face and hand numbness.  LEFT pupil dilatation.  EXAM: CT HEAD WITHOUT CONTRAST  TECHNIQUE: Contiguous axial images were obtained from the base of the skull through the vertex without intravenous contrast.  COMPARISON:  MRI of the brain report dated September 11, 2002 though images are not available for direct comparison.  FINDINGS: The ventricles and sulci are normal. No intraparenchymal hemorrhage, mass effect nor midline shift. No acute large vascular territory infarcts. Small focal LEFT frontal hypodensity extending to the cortex, axial 21/35.  No abnormal extra-axial fluid collections. Basal cisterns are patent.  No skull fracture. The included ocular globes and orbital contents are non-suspicious. Small LEFT maxillary mucosal retention cyst without paranasal sinus air-fluid levels. The mastoid air cells are well aerated, mild coalescence on the LEFT may reflect prior mastoiditis without acute component.  IMPRESSION: No acute intracranial process.  LEFT frontal small hypodensity may reflect encephalomalacia or, partially volumed sulcus.   Electronically Signed   By: Elon Alas   On: 12/21/2014 01:01   Ct Angio Chest Pe W/cm &/or Wo Cm  12/21/2014   CLINICAL DATA:  Left-sided numbness, chest pain.  EXAM: CT ANGIOGRAPHY CHEST WITH CONTRAST  TECHNIQUE: Multidetector CT imaging of the chest was performed using the standard protocol during bolus administration of intravenous contrast. Multiplanar CT image reconstructions and MIPs were obtained to evaluate the vascular anatomy.  CONTRAST:  62mL OMNIPAQUE IOHEXOL 350 MG/ML SOLN  COMPARISON:  01/18/2014  FINDINGS: Left thyroid lobe nodule with associated calcification. Postoperative changes of right thyroidectomy.  Suboptimal contrast bolus timing. No pulmonary  embolism identified. Some of the peripheral segmental and subsegmental branches are poorly assessed due to contrast bolus timing and respiratory motion.  Normal caliber aorta and branch vessels.  Upper normal heart size. Trace pericardial fluid. No pleural effusion.  No intrathoracic lymphadenopathy. Upper abdominal images show hepatic steatosis and lobular contour. Splenomegaly is again noted.  Central airways are patent. No pneumothorax. Mild dependent atelectasis. 5 mm right lower lobe pulmonary nodule, similar to prior. Subpleural lingular nodule measuring 4 mm,  similar to prior allowing for differences in slice selection. 3 mm right upper lobe nodule, unchanged.  Multilevel degenerative changes.  No acute osseous finding.  Review of the MIP images confirms the above findings.  IMPRESSION: Suboptimal contrast bolus timing and respiratory motion degrades assessment of some of the segmental and subsegmental branches. Within this limitation, no pulmonary embolism identified.  Pulmonary nodules are similar to prior and measure up to 5 mm.  Hepatic steatosis.  Nonspecific splenomegaly.  Indeterminate left thyroid lobe nodule can be further assessed with ultrasound.   Electronically Signed   By: Carlos Levering M.D.   On: 12/21/2014 02:44   Dg Chest Port 1 View  12/20/2014   CLINICAL DATA:  Chest pain and left upper extremity paresthesias  EXAM: PORTABLE CHEST - 1 VIEW  COMPARISON:  07/01/2011  FINDINGS: A single AP portable view of the chest demonstrates no focal airspace consolidation or alveolar edema. The lungs are grossly clear. There is no large effusion or pneumothorax. Cardiac and mediastinal contours appear unremarkable.  IMPRESSION: No active disease.   Electronically Signed   By: Andreas Newport M.D.   On: 12/20/2014 23:52     EKG Interpretation None     ED ECG REPORT   Date: 12/21/2014  Rate: 81  Rhythm: normal sinus rhythm  QRS Axis: normal  Intervals: normal  ST/T Wave abnormalities:  normal  Conduction Disutrbances:none  Narrative Interpretation:   Old EKG Reviewed: none available  I have personally reviewed the EKG tracing and agree with the computerized printout as noted.  MDM   Final diagnoses:  Numbness  Chest pain    Acute onset of bilateral chest pain at around 7 PM radiating to the back. First troponin negative. CT angios done to rule out dissection or pulmonary embolus because very pleuritic in nature. Patient the troponin was negative CT angios negative chest x-ray negative for pneumonia. No evidence pulmonary embolus. Patient pain improved with pain medicine but did not completely resolve. Patient had aspirin at home prior to arrival. Cardiac etiology not completely ruled out. But symptom-wise seems very unusual. Will require cardiac admission rule out.  I personally performed the services described in this documentation, which was scribed in my presence. The recorded information has been reviewed and is accurate.     Fredia Sorrow, MD 12/21/14 [phone]  Addendum: Patient's second cardiac marker troponin also negative. Unlikely that this is cardiac chest pain. Patient does has risk factors with the diabetes.  Fredia Sorrow, MD 12/21/14 [phone]
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
## 2014-12-21 ENCOUNTER — Encounter (HOSPITAL_COMMUNITY): Payer: Self-pay | Admitting: Radiology

## 2014-12-21 ENCOUNTER — Emergency Department (HOSPITAL_COMMUNITY): Payer: Managed Care, Other (non HMO)

## 2014-12-21 DIAGNOSIS — I25119 Atherosclerotic heart disease of native coronary artery with unspecified angina pectoris: Secondary | ICD-10-CM | POA: Diagnosis not present

## 2014-12-21 DIAGNOSIS — E118 Type 2 diabetes mellitus with unspecified complications: Secondary | ICD-10-CM | POA: Diagnosis not present

## 2014-12-21 DIAGNOSIS — R079 Chest pain, unspecified: Secondary | ICD-10-CM | POA: Diagnosis present

## 2014-12-21 DIAGNOSIS — E119 Type 2 diabetes mellitus without complications: Secondary | ICD-10-CM

## 2014-12-21 DIAGNOSIS — I2510 Atherosclerotic heart disease of native coronary artery without angina pectoris: Secondary | ICD-10-CM | POA: Diagnosis present

## 2014-12-21 DIAGNOSIS — I10 Essential (primary) hypertension: Secondary | ICD-10-CM | POA: Diagnosis not present

## 2014-12-21 DIAGNOSIS — Z8639 Personal history of other endocrine, nutritional and metabolic disease: Secondary | ICD-10-CM

## 2014-12-21 DIAGNOSIS — R200 Anesthesia of skin: Secondary | ICD-10-CM | POA: Insufficient documentation

## 2014-12-21 LAB — I-STAT TROPONIN, ED: TROPONIN I, POC: 0 ng/mL (ref 0.00–0.08)

## 2014-12-21 LAB — CREATININE, SERUM
Creatinine, Ser: 1.09 mg/dL (ref 0.50–1.35)
GFR, EST NON AFRICAN AMERICAN: 81 mL/min — AB (ref >90)

## 2014-12-21 LAB — TROPONIN I
Troponin I: <0.03 ng/mL (ref <0.031)
Troponin I: <0.03 ng/mL (ref <0.031)
Troponin I: <0.03 ng/mL (ref <0.031)

## 2014-12-21 LAB — CBC
HCT: 40 % (ref 39.0–52.0)
Hemoglobin: 14.1 g/dL (ref 13.0–17.0)
MCH: 29.4 pg (ref 26.0–34.0)
MCHC: 35.3 g/dL (ref 30.0–36.0)
MCV: 83.5 fL (ref 78.0–100.0)
Platelets: 163 10*3/uL (ref 150–400)
RBC: 4.79 MIL/uL (ref 4.22–5.81)
RDW: 13.2 % (ref 11.5–15.5)
WBC: 5.1 10*3/uL (ref 4.0–10.5)

## 2014-12-21 LAB — GLUCOSE, CAPILLARY
GLUCOSE-CAPILLARY: 322 mg/dL — AB (ref 70–99)
GLUCOSE-CAPILLARY: 301 mg/dL — AB (ref 70–99)
GLUCOSE-CAPILLARY: 236 mg/dL — AB (ref 70–99)

## 2014-12-21 MED ORDER — SODIUM CHLORIDE 0.9 % IV SOLN: 250.0000 mL | INTRAVENOUS | Status: DC | PRN

## 2014-12-21 MED ORDER — INSULIN ASPART 100 UNIT/ML ~~LOC~~ SOLN
0.0000 [IU] | Freq: Three times a day (TID) | SUBCUTANEOUS | Status: DC
  Administered 2014-12-21: 7 [IU] via SUBCUTANEOUS

## 2014-12-21 MED ORDER — LISINOPRIL 20 MG PO TABS
20.0000 mg | ORAL_TABLET | Freq: Every day | ORAL | Status: DC
  Administered 2014-12-21 – 2014-12-22 (×2): 20 mg via ORAL
  Filled 2014-12-21 (×2): qty 1

## 2014-12-21 MED ORDER — LISINOPRIL-HYDROCHLOROTHIAZIDE 20-25 MG PO TABS: 1.0000 | ORAL_TABLET | Freq: Every day | ORAL | Status: DC

## 2014-12-21 MED ORDER — HYDROCHLOROTHIAZIDE 25 MG PO TABS
25.0000 mg | ORAL_TABLET | Freq: Every day | ORAL | Status: DC
  Administered 2014-12-21 – 2014-12-22 (×2): 25 mg via ORAL
  Filled 2014-12-21 (×2): qty 1

## 2014-12-21 MED ORDER — OXYCODONE HCL 5 MG PO TABS
5.0000 mg | ORAL_TABLET | ORAL | Status: DC | PRN
  Filled 2014-12-21: qty 1

## 2014-12-21 MED ORDER — LEVOTHYROXINE SODIUM 75 MCG PO TABS
75.0000 ug | ORAL_TABLET | Freq: Every day | ORAL | Status: DC
  Administered 2014-12-21 – 2014-12-22 (×2): 75 ug via ORAL
  Filled 2014-12-21 (×4): qty 1

## 2014-12-21 MED ORDER — ASPIRIN EC 81 MG PO TBEC: 81.0000 mg | DELAYED_RELEASE_TABLET | Freq: Every day | ORAL | Status: DC

## 2014-12-21 MED ORDER — ACETAMINOPHEN 650 MG RE SUPP: 650.0000 mg | Freq: Four times a day (QID) | RECTAL | Status: DC | PRN

## 2014-12-21 MED ORDER — AMLODIPINE BESYLATE 5 MG PO TABS
5.0000 mg | ORAL_TABLET | Freq: Every day | ORAL | Status: DC
  Administered 2014-12-21 – 2014-12-22 (×2): 5 mg via ORAL
  Filled 2014-12-21 (×2): qty 1

## 2014-12-21 MED ORDER — ENOXAPARIN SODIUM 40 MG/0.4ML ~~LOC~~ SOLN
40.0000 mg | SUBCUTANEOUS | Status: DC
  Administered 2014-12-21 – 2014-12-22 (×2): 40 mg via SUBCUTANEOUS
  Filled 2014-12-21 (×2): qty 0.4

## 2014-12-21 MED ORDER — PANTOPRAZOLE SODIUM 40 MG PO TBEC
40.0000 mg | DELAYED_RELEASE_TABLET | Freq: Every day | ORAL | Status: DC
  Administered 2014-12-21 – 2014-12-22 (×2): 40 mg via ORAL
  Filled 2014-12-21 (×2): qty 1

## 2014-12-21 MED ORDER — SODIUM CHLORIDE 0.9 % IJ SOLN: 3.0000 mL | Freq: Two times a day (BID) | INTRAMUSCULAR | Status: DC

## 2014-12-21 MED ORDER — ONDANSETRON HCL 4 MG PO TABS: 4.0000 mg | ORAL_TABLET | Freq: Four times a day (QID) | ORAL | Status: DC | PRN

## 2014-12-21 MED ORDER — ENOXAPARIN SODIUM 30 MG/0.3ML ~~LOC~~ SOLN
30.0000 mg | SUBCUTANEOUS | Status: DC
  Filled 2014-12-21: qty 0.3

## 2014-12-21 MED ORDER — INSULIN ASPART 100 UNIT/ML ~~LOC~~ SOLN
0.0000 [IU] | Freq: Every day | SUBCUTANEOUS | Status: DC
  Administered 2014-12-21: 2 [IU] via SUBCUTANEOUS

## 2014-12-21 MED ORDER — ATORVASTATIN CALCIUM 20 MG PO TABS
20.0000 mg | ORAL_TABLET | Freq: Every day | ORAL | Status: DC
  Administered 2014-12-21 – 2014-12-22 (×2): 20 mg via ORAL
  Filled 2014-12-21 (×2): qty 1

## 2014-12-21 MED ORDER — SODIUM CHLORIDE 0.9 % IJ SOLN
3.0000 mL | Freq: Two times a day (BID) | INTRAMUSCULAR | Status: DC
  Administered 2014-12-21: 3 mL via INTRAVENOUS

## 2014-12-21 MED ORDER — ASPIRIN EC 325 MG PO TBEC
325.0000 mg | DELAYED_RELEASE_TABLET | Freq: Every day | ORAL | Status: DC
  Administered 2014-12-21 – 2014-12-22 (×2): 325 mg via ORAL
  Filled 2014-12-21 (×2): qty 1

## 2014-12-21 MED ORDER — ALUM & MAG HYDROXIDE-SIMETH 200-200-20 MG/5ML PO SUSP
30.0000 mL | Freq: Four times a day (QID) | ORAL | Status: DC | PRN
  Administered 2014-12-21: 30 mL via ORAL
  Filled 2014-12-21: qty 30

## 2014-12-21 MED ORDER — ONDANSETRON HCL 4 MG/2ML IJ SOLN: 4.0000 mg | Freq: Four times a day (QID) | INTRAMUSCULAR | Status: DC | PRN

## 2014-12-21 MED ORDER — HYDROMORPHONE HCL 1 MG/ML IJ SOLN
1.0000 mg | Freq: Once | INTRAMUSCULAR | Status: AC
  Administered 2014-12-21: 1 mg via INTRAVENOUS
  Filled 2014-12-21: qty 1

## 2014-12-21 MED ORDER — HYDROMORPHONE HCL 1 MG/ML IJ SOLN
0.5000 mg | INTRAMUSCULAR | Status: DC | PRN
  Administered 2014-12-21: 1 mg via INTRAVENOUS
  Filled 2014-12-21: qty 1

## 2014-12-21 MED ORDER — SODIUM CHLORIDE 0.9 % IJ SOLN: 3.0000 mL | INTRAMUSCULAR | Status: DC | PRN

## 2014-12-21 MED ORDER — INSULIN ASPART 100 UNIT/ML ~~LOC~~ SOLN
0.0000 [IU] | Freq: Three times a day (TID) | SUBCUTANEOUS | Status: DC
  Administered 2014-12-21: 15 [IU] via SUBCUTANEOUS

## 2014-12-21 MED ORDER — ACETAMINOPHEN 325 MG PO TABS
650.0000 mg | ORAL_TABLET | Freq: Four times a day (QID) | ORAL | Status: DC | PRN
  Administered 2014-12-21 – 2014-12-22 (×2): 650 mg via ORAL
  Filled 2014-12-21: qty 2

## 2014-12-21 MED ORDER — INSULIN ASPART 100 UNIT/ML ~~LOC~~ SOLN: 0.0000 [IU] | Freq: Every day | SUBCUTANEOUS | Status: DC

## 2014-12-21 MED ORDER — IOHEXOL 350 MG/ML SOLN
75.0000 mL | Freq: Once | INTRAVENOUS | Status: AC | PRN
  Administered 2014-12-21: 75 mL via INTRAVENOUS

## 2014-12-21 NOTE — H&P (Signed)
[facility] Admission History and Physical       Kirk Lewis DOB: October 17, 1970 DOA: 12/20/2014  Referring physician: EDP PCP: Horatio Pel, MD  Specialists:   Chief Complaint: Chest Pain  HPI: Kirk Lewis is a 44 y.o. male with a history of CAD, HTN, DM2, who presents to the ED with complaints of chest heaviness and tightness associated with SOB, nausea and dizziness at 7 pm while he was at a cookout.   He rates the pain at a 4/10, and the pain recurred around 10 pm, so he decided to go to the ED.  He reports also having som numbness in his face and neck.   He was evaluated in the ED and troponins x 2 were negative .    He was referred for further evaluation.   He reports having a Stress test Done 2-3 years ago.     Review of Systems:  Constitutional: No Weight Loss, No Weight Gain, Night Sweats, Fevers, Chills, Dizziness, Light Headedness, Fatigue, or Generalized Weakness HEENT: No Headaches, Difficulty Swallowing,Tooth/Dental Problems,Sore Throat,  No Sneezing, Rhinitis, Ear Ache, Nasal Congestion, or Post Nasal Drip,  Cardio-vascular:  No +Chest pain, Orthopnea, PND, Edema in Lower Extremities, Anasarca, Dizziness, Palpitations  Resp: +Dyspnea, No DOE, No Productive Cough, No Non-Productive Cough, No Hemoptysis, No Wheezing.    GI: No Heartburn, Indigestion, Abdominal Pain, Nausea, Vomiting, Diarrhea, Constipation, Hematemesis, Hematochezia, Melena, Change in Bowel Habits,  Loss of Appetite  GU: No Dysuria, No Change in Color of Urine, No Urgency or Urinary Frequency, No Flank pain.  Musculoskeletal: No Joint Pain or Swelling, No Decreased Range of Motion, No Back Pain.  Neurologic: No Syncope, No Seizures, Muscle Weakness, Paresthesia, Vision Disturbance or Loss, No Diplopia, No Vertigo, No Difficulty Walking,  Skin: No Rash or Lesions. Psych: No Change in Mood or Affect, No Depression or Anxiety, No Memory loss, No Confusion, or  Hallucinations   Past Medical History  Diagnosis Date  . Cellulitis 2003    R knee  . Hypertension   . Thyroid disease   . Coronary artery disease   . Diabetes mellitus without complication      Past Surgical History  Procedure Laterality Date  . Thyroid surgery        Prior to Admission medications   Medication Sig Start Date End Date Taking? Authorizing Provider  amLODipine (NORVASC) 5 MG tablet Take 5 mg by mouth daily.   Yes Historical Provider, MD  aspirin EC 81 MG tablet Take 81 mg by mouth daily.   Yes Historical Provider, MD  atorvastatin (LIPITOR) 40 MG tablet Take 20 mg by mouth daily.   Yes Historical Provider, MD  HYDROcodone-acetaminophen (NORCO/VICODIN) 5-325 MG per tablet Take 1 tablet by mouth every 6 (six) hours as needed. Patient taking differently: Take 1 tablet by mouth every 6 (six) hours as needed for moderate pain.  06/11/14  Yes Carmin Muskrat, MD  levothyroxine (SYNTHROID, LEVOTHROID) 75 MCG tablet Take 75 mcg by mouth daily before breakfast.   Yes Historical Provider, MD  lisinopril-hydrochlorothiazide (PRINZIDE,ZESTORETIC) 20-25 MG per tablet Take 1 tablet by mouth daily.   Yes Historical Provider, MD  metFORMIN (GLUCOPHAGE) 500 MG tablet Take 1 tablet (500 mg total) by mouth 2 (two) times daily with a meal. 02/15/12 12/21/14 Yes Barton Dubois, MD  pantoprazole (PROTONIX) 40 MG tablet Take 40 mg by mouth daily.   Yes Historical Provider, MD  Omega-3 Fatty Acids (FISH OIL PO) Take 1 capsule by mouth every other  day.    Historical Provider, MD  PRESCRIPTION MEDICATION Take 1 tablet by mouth daily. Thyroid    Historical Provider, MD  PRESCRIPTION MEDICATION Take 1 tablet by mouth daily. cholesterol    Historical Provider, MD     No Known Allergies     Social History:  reports that he has never smoked. He has never used smokeless tobacco. He reports that he does not drink alcohol or use illicit drugs.      Family History:     CAD in Father  CAD in  Brother  COPD in Father   Physical Exam:  GEN:  Pleasant Obese 44 y.o.  [place] male examined and in no acute distress; cooperative with exam Filed Vitals:   12/21/14 0230 12/21/14 0315 12/21/14 0430 12/21/14 0445  BP: 130/70 130/61 106/68 125/78  Pulse: 83 80 81 75  Temp:      TempSrc:      Resp: 17 18 18 17   SpO2: 95% 96% 95% 94%   Blood pressure 125/78, pulse 75, temperature 98.1 F (36.7 C), temperature source Oral, resp. rate 17, SpO2 94 %. PSYCH: He is alert and oriented x4; does not appear anxious does not appear depressed; affect is normal HEENT: Normocephalic and Atraumatic, Mucous membranes pink; PERRLA; EOM intact; Fundi:  Benign;  No scleral icterus, Nares: Patent, Oropharynx: Clear, Fair Dentition,    Neck:  FROM, No Cervical Lymphadenopathy nor Thyromegaly or Carotid Bruit; No JVD; Breasts:: Not examined CHEST WALL: No tenderness CHEST: Normal respiration, clear to auscultation bilaterally HEART: Regular rate and rhythm; no murmurs rubs or gallops BACK: No kyphosis or scoliosis; No CVA tenderness ABDOMEN: Positive Bowel Sounds, Obese, Soft Non-Tender, No Rebound or Guarding; No Masses, No Organomegaly Rectal Exam: Not done EXTREMITIES: No Cyanosis, Clubbing, or Edema; No Ulcerations. Genitalia: not examined PULSES: 2+ and symmetric SKIN: Normal hydration no rash or ulceration CNS:  Alert and Oriented x 4, No Focal Deficits Vascular: pulses palpable throughout    Labs on Admission:  Basic Metabolic Panel:  Recent Labs Lab 12/20/14 2300  NA 133*  K 4.3  CL 99  CO2 22  GLUCOSE 361*  BUN 14  CREATININE 1.12  CALCIUM 8.8   Liver Function Tests: No results for input(s): AST, ALT, ALKPHOS, BILITOT, PROT, ALBUMIN in the last 168 hours. No results for input(s): LIPASE, AMYLASE in the last 168 hours. No results for input(s): AMMONIA in the last 168 hours. CBC:  Recent Labs Lab 12/20/14 2300  WBC 5.9  HGB 14.8  HCT 40.3  MCV 81.9  PLT 174    Cardiac Enzymes: No results for input(s): CKTOTAL, CKMB, CKMBINDEX, TROPONINI in the last 168 hours.  BNP (last 3 results)  Recent Labs  12/20/14 2300  BNP 8.0    ProBNP (last 3 results) No results for input(s): PROBNP in the last 8760 hours.  CBG: No results for input(s): GLUCAP in the last 168 hours.  Radiological Exams on Admission: Ct Head Wo Contrast  12/21/2014   CLINICAL DATA:  LEFT face and hand numbness.  LEFT pupil dilatation.  EXAM: CT HEAD WITHOUT CONTRAST  TECHNIQUE: Contiguous axial images were obtained from the base of the skull through the vertex without intravenous contrast.  COMPARISON:  MRI of the brain report dated September 11, 2002 though images are not available for direct comparison.  FINDINGS: The ventricles and sulci are normal. No intraparenchymal hemorrhage, mass effect nor midline shift. No acute large vascular territory infarcts. Small focal LEFT frontal hypodensity extending to the  cortex, axial 21/35.  No abnormal extra-axial fluid collections. Basal cisterns are patent.  No skull fracture. The included ocular globes and orbital contents are non-suspicious. Small LEFT maxillary mucosal retention cyst without paranasal sinus air-fluid levels. The mastoid air cells are well aerated, mild coalescence on the LEFT may reflect prior mastoiditis without acute component.  IMPRESSION: No acute intracranial process.  LEFT frontal small hypodensity may reflect encephalomalacia or, partially volumed sulcus.   Electronically Signed   By: Elon Alas   On: 12/21/2014 01:01   Ct Angio Chest Pe W/cm &/or Wo Cm  12/21/2014   CLINICAL DATA:  Left-sided numbness, chest pain.  EXAM: CT ANGIOGRAPHY CHEST WITH CONTRAST  TECHNIQUE: Multidetector CT imaging of the chest was performed using the standard protocol during bolus administration of intravenous contrast. Multiplanar CT image reconstructions and MIPs were obtained to evaluate the vascular anatomy.  CONTRAST:  15mL  OMNIPAQUE IOHEXOL 350 MG/ML SOLN  COMPARISON:  01/18/2014  FINDINGS: Left thyroid lobe nodule with associated calcification. Postoperative changes of right thyroidectomy.  Suboptimal contrast bolus timing. No pulmonary embolism identified. Some of the peripheral segmental and subsegmental branches are poorly assessed due to contrast bolus timing and respiratory motion.  Normal caliber aorta and branch vessels.  Upper normal heart size. Trace pericardial fluid. No pleural effusion.  No intrathoracic lymphadenopathy. Upper abdominal images show hepatic steatosis and lobular contour. Splenomegaly is again noted.  Central airways are patent. No pneumothorax. Mild dependent atelectasis. 5 mm right lower lobe pulmonary nodule, similar to prior. Subpleural lingular nodule measuring 4 mm, similar to prior allowing for differences in slice selection. 3 mm right upper lobe nodule, unchanged.  Multilevel degenerative changes.  No acute osseous finding.  Review of the MIP images confirms the above findings.  IMPRESSION: Suboptimal contrast bolus timing and respiratory motion degrades assessment of some of the segmental and subsegmental branches. Within this limitation, no pulmonary embolism identified.  Pulmonary nodules are similar to prior and measure up to 5 mm.  Hepatic steatosis.  Nonspecific splenomegaly.  Indeterminate left thyroid lobe nodule can be further assessed with ultrasound.   Electronically Signed   By: Carlos Levering M.D.   On: 12/21/2014 02:44   Dg Chest Port 1 View  12/20/2014   CLINICAL DATA:  Chest pain and left upper extremity paresthesias  EXAM: PORTABLE CHEST - 1 VIEW  COMPARISON:  07/01/2011  FINDINGS: A single AP portable view of the chest demonstrates no focal airspace consolidation or alveolar edema. The lungs are grossly clear. There is no large effusion or pneumothorax. Cardiac and mediastinal contours appear unremarkable.  IMPRESSION: No active disease.   Electronically Signed   By: Andreas Newport M.D.   On: 12/20/2014 23:52     EKG: Independently reviewed.    Assessment/Plan:   44 y.o. male with  Principal Problem:   1.    Chest pain   Telemetry Monitoring   Nitropaste, O2, ASA    Continue Atorvastatin   Active Problems:   2.   Coronary artery disease   Continue Atorvastatin Rx    And ASA Rx     3.   HTN (hypertension)   Continue Lisinopril Rx    4.   Hx of thyroid disease   Continue Levothyroxine Rx     5.   DM type 2 (diabetes mellitus, type 2)   Hold Metformin RX   SSI coverage PRN     6.   DVT Prophylaxis    Lovenox  Code Status:     FULL CODE   Family Communication:    No Family Present    Disposition Plan:   Observation Status        Time spent:  [place] [facility] Pager [phone]   If [place] Please Contact the Day Rounding Team MD for [facility]  If 7PM-7AM, Please Contact Night-Floor Coverage  [email or web address] Password [email or web address] 12/21/2014, 5:01 AM     ADDENDUM:   Patient was seen and examined on 12/21/2014

## 2014-12-21 NOTE — Progress Notes (Addendum)
Follow-up note:  Patient admitted earlier this morning.  44 year old male with past history of diabetes mellitus, hypothyroidism and hypertension who was in usual state of health when he started developing mid to left sided heavy chest pressure 10/10 with radiation to left side of neck as well as left arm numbness and associated shortness of breath and nausea. Initial EKG and enzymes were normal. Patient has a heart score of 4. Overnight, no issues, but patient's chest pressure is now still persisting at a 4. He is otherwise stable. Given very classic story, plan will be by cardiology for stress test tomorrow morning. In the meantime will cover elevated blood sugars, A1c pending. Continue other home meds.  Patient also notes increased chest pain when trying to sit up and leaning forward causing more shortness of breath. No clinical exam findings of pericardial effusion, but will check echo for completeness

## 2014-12-21 NOTE — Progress Notes (Signed)
UR completed 

## 2014-12-21 NOTE — Progress Notes (Signed)
Nursing note Patient with sudden onset of chest pressure 4/10 lightheaded and dizzyness recently finished eating supper, bp 117/67 placed on 02 [place] 2l sats 100 % heart rate 72, pain medication given as ordered as needed for pain. EKG obtained. Will continue to monitor patient. Rupert Azzara, Bettina Gavia RN

## 2014-12-22 ENCOUNTER — Observation Stay (HOSPITAL_COMMUNITY): Payer: Managed Care, Other (non HMO)

## 2014-12-22 DIAGNOSIS — E1165 Type 2 diabetes mellitus with hyperglycemia: Secondary | ICD-10-CM

## 2014-12-22 DIAGNOSIS — I10 Essential (primary) hypertension: Secondary | ICD-10-CM | POA: Diagnosis not present

## 2014-12-22 DIAGNOSIS — R079 Chest pain, unspecified: Secondary | ICD-10-CM

## 2014-12-22 DIAGNOSIS — R072 Precordial pain: Secondary | ICD-10-CM

## 2014-12-22 LAB — CBC
HCT: 41.6 % (ref 39.0–52.0)
Hemoglobin: 14.5 g/dL (ref 13.0–17.0)
MCH: 29.4 pg (ref 26.0–34.0)
MCHC: 34.9 g/dL (ref 30.0–36.0)
MCV: 84.4 fL (ref 78.0–100.0)
PLATELETS: 156 10*3/uL (ref 150–400)
RBC: 4.93 MIL/uL (ref 4.22–5.81)
RDW: 13.5 % (ref 11.5–15.5)
WBC: 5.9 10*3/uL (ref 4.0–10.5)

## 2014-12-22 LAB — LIPID PANEL
Cholesterol: 106 mg/dL (ref 0–200)
HDL: 16 mg/dL — ABNORMAL LOW (ref >39)
LDL Cholesterol: UNDETERMINED mg/dL (ref 0–99)
Total CHOL/HDL Ratio: 6.6 RATIO
Triglycerides: 724 mg/dL — ABNORMAL HIGH (ref <150)
VLDL: UNDETERMINED mg/dL (ref 0–40)

## 2014-12-22 LAB — BASIC METABOLIC PANEL
Anion gap: 5 (ref 5–15)
BUN: 14 mg/dL (ref 6–23)
CALCIUM: 8.9 mg/dL (ref 8.4–10.5)
CO2: 32 mmol/L (ref 19–32)
CREATININE: 1.16 mg/dL (ref 0.50–1.35)
Chloride: 97 mmol/L (ref 96–112)
GFR calc Af Amer: 87 mL/min — ABNORMAL LOW (ref >90)
GFR calc non Af Amer: 75 mL/min — ABNORMAL LOW (ref >90)
Glucose, Bld: 298 mg/dL — ABNORMAL HIGH (ref 70–99)
POTASSIUM: 3.9 mmol/L (ref 3.5–5.1)
SODIUM: 134 mmol/L — AB (ref 135–145)

## 2014-12-22 LAB — GLUCOSE, CAPILLARY: Glucose-Capillary: 330 mg/dL — ABNORMAL HIGH (ref 70–99)

## 2014-12-22 MED ORDER — REGADENOSON 0.4 MG/5ML IV SOLN
0.4000 mg | Freq: Once | INTRAVENOUS | Status: AC
  Administered 2014-12-22: 0.4 mg via INTRAVENOUS

## 2014-12-22 MED ORDER — TECHNETIUM TC 99M SESTAMIBI GENERIC - CARDIOLITE
10.0000 | Freq: Once | INTRAVENOUS | Status: AC | PRN
  Administered 2014-12-22: 10 via INTRAVENOUS

## 2014-12-22 MED ORDER — REGADENOSON 0.4 MG/5ML IV SOLN
INTRAVENOUS | Status: AC
  Filled 2014-12-22: qty 5

## 2014-12-22 MED ORDER — TECHNETIUM TC 99M SESTAMIBI - CARDIOLITE
30.0000 | Freq: Once | INTRAVENOUS | Status: AC | PRN
  Administered 2014-12-22: 30 via INTRAVENOUS

## 2014-12-22 MED ORDER — ACETAMINOPHEN 325 MG PO TABS
ORAL_TABLET | ORAL | Status: AC
  Filled 2014-12-22: qty 2

## 2014-12-22 NOTE — Progress Notes (Signed)
UR completed 

## 2014-12-22 NOTE — Progress Notes (Signed)
Pt CBG was taken fifteen minutes after eating lunch. Pt's family had brought lunch.

## 2014-12-22 NOTE — Progress Notes (Signed)
Pt discharged home with wife.  Alert and oriented x4.  No c/o pain.  Education given on diet, activity, meds, and follow-up care and instructions.  Pt verbalized understanding.  IV D/Cd Tele D/Cd

## 2014-12-24 LAB — HEMOGLOBIN A1C
HEMOGLOBIN A1C: 10.9 % — AB (ref 4.8–5.6)
MEAN PLASMA GLUCOSE: 266 mg/dL

## 2014-12-25 NOTE — Discharge Summary (Signed)
Discharge Summary  Kirk Lewis KYH:[id number] DOB: September 17, 1971  PCP: Horatio Pel, MD  Admit date: 12/20/2014 Discharge date: 12/22/2014  Time spent: 25 minutes  Recommendations for Outpatient Follow-up:  1. Patient will follow-up with his PCP for further workup of non-urgent chest pain as well as adjustment in his medication for diabetes mellitus  Discharge Diagnoses:  Active Hospital Problems   Diagnosis Date Noted  . Chest pain 12/21/2014  . DM type 2 (diabetes mellitus, type 2) 12/21/2014  . Coronary artery disease 12/21/2014  . Numbness   . HTN (hypertension) 02/13/2012  . Hx of thyroid disease 02/13/2012    Resolved Hospital Problems   Diagnosis Date Noted Date Resolved  No resolved problems to display.    Discharge Condition: Improved, being discharged home  Diet recommendation: Carb modified, low sodium  Filed Weights   12/21/14 0528  Weight: 116.7 kg (257 lb 4.4 oz)    History of present illness:  Patient admitted earlier this morning. 44 year old male with past history of diabetes mellitus, hypothyroidism and hypertension who was in usual state of health when he started developing mid to left sided heavy chest pressure 10/10 with radiation to left side of neck as well as left arm numbness and associated shortness of breath and nausea. Initial EKG and enzymes were normal. Patient has a heart score of 4.  Hospital Course:  Principal Problem:   Chest pain: Enzymes 3 normal. Initial EKG normal. Patient's chest pressure by following day still persisting at 4/10. Discussed with cardiology and given his very classic story for ischemia, the planned and did stress test on 3/20. Stress test was normal and patient was felt to be medically stable and discharged home. Active Problems:   HTN (hypertension): Stable continue on antihypertensives   Hx of thyroid disease: Stable. Continued on Synthroid.   DM type 2 (diabetes mellitus, type 2): Patient noted to have  elevated blood sugars. A1c ordered, but lab results delayed until post discharge. A1c back at 10.9. Have left message for patient on his phone with results. Also sending this report to his primary care physician and the patient will need his medications adjusted.     Procedures: 3/20: Nuclear medicine stress test: Preserved ejection fraction of 59%.No decreased activity in the left ventricle on stress imaging to suggest reversible ischemia or infarction.  Consultations:  Cardiology  Discharge Exam: BP 122/75 mmHg  Pulse 85  Temp(Src) 97.5 F (36.4 C) (Oral)  Resp 18  Ht 6' (1.829 m)  Wt 116.7 kg (257 lb 4.4 oz)  BMI 34.89 kg/m2  SpO2 98%  General: Alert and oriented 3, no acute distress Cardiovascular: Regular rate and rhythm, S1-S2 Respiratory: Clear to auscultation bilaterally  Discharge Instructions You were cared for by a hospitalist during your hospital stay. If you have any questions about your discharge medications or the care you received while you were in the hospital after you are discharged, you can call the unit and asked to speak with the hospitalist on call if the hospitalist that took care of you is not available. Once you are discharged, your primary care physician will handle any further medical issues. Please note that NO REFILLS for any discharge medications will be authorized once you are discharged, as it is imperative that you return to your primary care physician (or establish a relationship with a primary care physician if you do not have one) for your aftercare needs so that they can reassess your need for medications and monitor your lab values.  Discharge Instructions    Diet - low sodium heart healthy    Complete by:  As directed      Increase activity slowly    Complete by:  As directed             Medication List    STOP taking these medications        FISH OIL PO     PRESCRIPTION MEDICATION      TAKE these medications        amLODipine  5 MG tablet  Commonly known as:  NORVASC  Take 5 mg by mouth daily.     aspirin EC 81 MG tablet  Take 81 mg by mouth daily.     atorvastatin 40 MG tablet  Commonly known as:  LIPITOR  Take 20 mg by mouth daily.     HYDROcodone-acetaminophen 5-325 MG per tablet  Commonly known as:  NORCO/VICODIN  Take 1 tablet by mouth every 6 (six) hours as needed.     levothyroxine 75 MCG tablet  Commonly known as:  SYNTHROID, LEVOTHROID  Take 75 mcg by mouth daily before breakfast.     lisinopril-hydrochlorothiazide 20-25 MG per tablet  Commonly known as:  PRINZIDE,ZESTORETIC  Take 1 tablet by mouth daily.     metFORMIN 500 MG tablet  Commonly known as:  GLUCOPHAGE  Take 1 tablet (500 mg total) by mouth 2 (two) times daily with a meal.     pantoprazole 40 MG tablet  Commonly known as:  PROTONIX  Take 40 mg by mouth daily.       No Known Allergies     Follow-up Information    Follow up with Horatio Pel, MD In 1 month.   Specialty:  Internal Medicine   Contact information:   [address] [place] [address] [phone]        The results of significant diagnostics from this hospitalization (including imaging, microbiology, ancillary and laboratory) are listed below for reference.    Significant Diagnostic Studies: Ct Head Wo Contrast  12/21/2014   CLINICAL DATA:  LEFT face and hand numbness.  LEFT pupil dilatation.  EXAM: CT HEAD WITHOUT CONTRAST  TECHNIQUE: Contiguous axial images were obtained from the base of the skull through the vertex without intravenous contrast.  COMPARISON:  MRI of the brain report dated September 11, 2002 though images are not available for direct comparison.  FINDINGS: The ventricles and sulci are normal. No intraparenchymal hemorrhage, mass effect nor midline shift. No acute large vascular territory infarcts. Small focal LEFT frontal hypodensity extending to the cortex, axial 21/35.  No abnormal extra-axial fluid collections.  Basal cisterns are patent.  No skull fracture. The included ocular globes and orbital contents are non-suspicious. Small LEFT maxillary mucosal retention cyst without paranasal sinus air-fluid levels. The mastoid air cells are well aerated, mild coalescence on the LEFT may reflect prior mastoiditis without acute component.  IMPRESSION: No acute intracranial process.  LEFT frontal small hypodensity may reflect encephalomalacia or, partially volumed sulcus.   Electronically Signed   By: Elon Alas   On: 12/21/2014 01:01   Ct Angio Chest Pe W/cm &/or Wo Cm  12/21/2014   CLINICAL DATA:  Left-sided numbness, chest pain.  EXAM: CT ANGIOGRAPHY CHEST WITH CONTRAST  TECHNIQUE: Multidetector CT imaging of the chest was performed using the standard protocol during bolus administration of intravenous contrast. Multiplanar CT image reconstructions and MIPs were obtained to evaluate the vascular anatomy.  CONTRAST:  72mL OMNIPAQUE IOHEXOL 350 MG/ML  SOLN  COMPARISON:  01/18/2014  FINDINGS: Left thyroid lobe nodule with associated calcification. Postoperative changes of right thyroidectomy.  Suboptimal contrast bolus timing. No pulmonary embolism identified. Some of the peripheral segmental and subsegmental branches are poorly assessed due to contrast bolus timing and respiratory motion.  Normal caliber aorta and branch vessels.  Upper normal heart size. Trace pericardial fluid. No pleural effusion.  No intrathoracic lymphadenopathy. Upper abdominal images show hepatic steatosis and lobular contour. Splenomegaly is again noted.  Central airways are patent. No pneumothorax. Mild dependent atelectasis. 5 mm right lower lobe pulmonary nodule, similar to prior. Subpleural lingular nodule measuring 4 mm, similar to prior allowing for differences in slice selection. 3 mm right upper lobe nodule, unchanged.  Multilevel degenerative changes.  No acute osseous finding.  Review of the MIP images confirms the above findings.   IMPRESSION: Suboptimal contrast bolus timing and respiratory motion degrades assessment of some of the segmental and subsegmental branches. Within this limitation, no pulmonary embolism identified.  Pulmonary nodules are similar to prior and measure up to 5 mm.  Hepatic steatosis.  Nonspecific splenomegaly.  Indeterminate left thyroid lobe nodule can be further assessed with ultrasound.   Electronically Signed   By: Carlos Levering M.D.   On: 12/21/2014 02:44   Nm Myocar Multi W/spect W/wall Motion / Ef  12/22/2014   CLINICAL DATA:  44 year old male with chest pain  EXAM: MYOCARDIAL IMAGING WITH SPECT (REST AND PHARMACOLOGIC-STRESS)  GATED LEFT VENTRICULAR WALL MOTION STUDY  LEFT VENTRICULAR EJECTION FRACTION  TECHNIQUE: Standard myocardial SPECT imaging was performed after resting intravenous injection of 10 mCi Tc-87m sestamibi. Subsequently, intravenous infusion of Lexiscan was performed under the supervision of the Cardiology staff. At peak effect of the drug, 30 mCi Tc-77m sestamibi was injected intravenously and standard myocardial SPECT imaging was performed. Quantitative gated imaging was also performed to evaluate left ventricular wall motion, and estimate left ventricular ejection fraction.  COMPARISON:  None.  FINDINGS: Perfusion: No decreased activity in the left ventricle on stress imaging to suggest reversible ischemia or infarction.  Wall Motion: Normal left ventricular wall motion. No left ventricular dilation.  Left Ventricular Ejection Fraction: 59 %  End diastolic volume 433 ml  End systolic volume 52 ml  IMPRESSION: 1. No reversible ischemia or infarction.  2. Normal left ventricular wall motion.  3. Left ventricular ejection fraction 59%  4. Low-risk stress test findings*.  *2012 Appropriate Use Criteria for Coronary Revascularization Focused Update: J Am Coll Cardiol. 2951;88(4):166-063. http://content.airportbarriers.com.aspx?articleid=1201161   Electronically Signed   By: Suzy Bouchard M.D.   On: 12/22/2014 14:49   Dg Chest Port 1 View  12/20/2014   CLINICAL DATA:  Chest pain and left upper extremity paresthesias  EXAM: PORTABLE CHEST - 1 VIEW  COMPARISON:  07/01/2011  FINDINGS: A single AP portable view of the chest demonstrates no focal airspace consolidation or alveolar edema. The lungs are grossly clear. There is no large effusion or pneumothorax. Cardiac and mediastinal contours appear unremarkable.  IMPRESSION: No active disease.   Electronically Signed   By: Andreas Newport M.D.   On: 12/20/2014 23:52    Microbiology: No results found for this or any previous visit (from the past 240 hour(s)).   Labs: Basic Metabolic Panel:  Recent Labs Lab 12/20/14 2300 12/21/14 0541 12/22/14 0500  NA 133*  --  134*  K 4.3  --  3.9  CL 99  --  97  CO2 22  --  32  GLUCOSE 361*  --  298*  BUN 14  --  14  CREATININE 1.12 1.09 1.16  CALCIUM 8.8  --  8.9   Liver Function Tests: No results for input(s): AST, ALT, ALKPHOS, BILITOT, PROT, ALBUMIN in the last 168 hours. No results for input(s): LIPASE, AMYLASE in the last 168 hours. No results for input(s): AMMONIA in the last 168 hours. CBC:  Recent Labs Lab 12/20/14 2300 12/21/14 0541 12/22/14 0500  WBC 5.9 5.1 5.9  HGB 14.8 14.1 14.5  HCT 40.3 40.0 41.6  MCV 81.9 83.5 84.4  PLT 174 163 156   Cardiac Enzymes:  Recent Labs Lab 12/21/14 0909 12/21/14 1605 12/21/14 2100  TROPONINI <0.03 <0.03 <0.03   BNP: BNP (last 3 results)  Recent Labs  12/20/14 2300  BNP 8.0    ProBNP (last 3 results) No results for input(s): PROBNP in the last 8760 hours.  CBG:  Recent Labs Lab 12/21/14 1145 12/21/14 1631 12/21/14 2140 12/22/14 1127  GLUCAP 322* 301* 236* 330*       Signed:  Anushka Hartinger K  [facility] 12/25/2014, 9:31 AM

## 2015-01-21 ENCOUNTER — Ambulatory Visit: Payer: Self-pay | Admitting: Neurology

## 2015-01-21 ENCOUNTER — Telehealth: Payer: Self-pay

## 2015-01-21 NOTE — Telephone Encounter (Signed)
Patient did not come to appt today.

## 2015-01-30 ENCOUNTER — Encounter: Payer: Self-pay | Admitting: Neurology

## 2015-08-21 IMAGING — CT CT ANGIO CHEST
2 of 9 series · 17 of 46 positions shown · IV contrast (Omni 300)
Comparison: 01/18/2014

CLINICAL DATA: Left-sided numbness, chest pain.

EXAM:
CT ANGIOGRAPHY CHEST WITH CONTRAST
TECHNIQUE: Multidetector CT imaging of the chest was performed using the
standard protocol during bolus administration of intravenous
contrast. Multiplanar CT image reconstructions and MIPs were
obtained to evaluate the vascular anatomy.
CONTRAST:  75mL OMNIPAQUE IOHEXOL 350 MG/ML SOLN

[Series 5: thins · axial · 0.79mm/px · z∈[-698,-416]mm · 14 of 319 slices shown]
[im 18/319  lung]
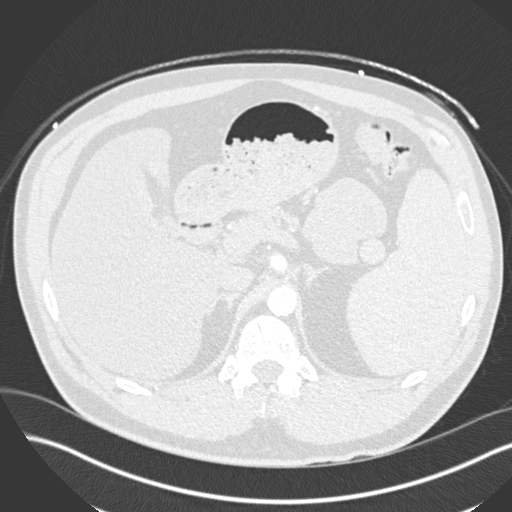
[im 36/319  soft-tissue]
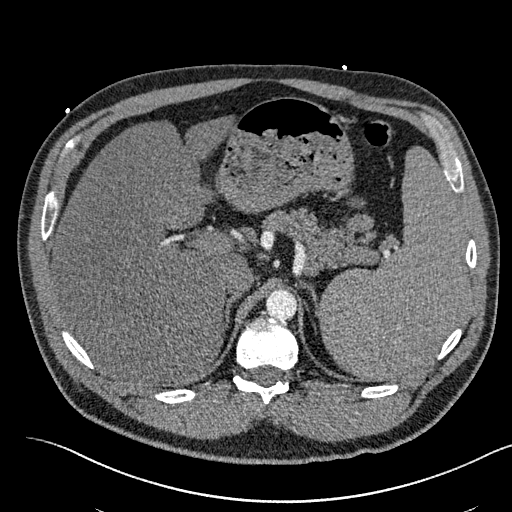
[im 71/319  lung]
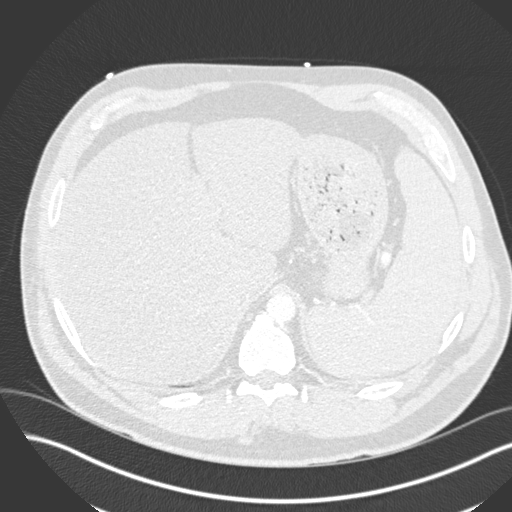
[im 89/319  soft-tissue]
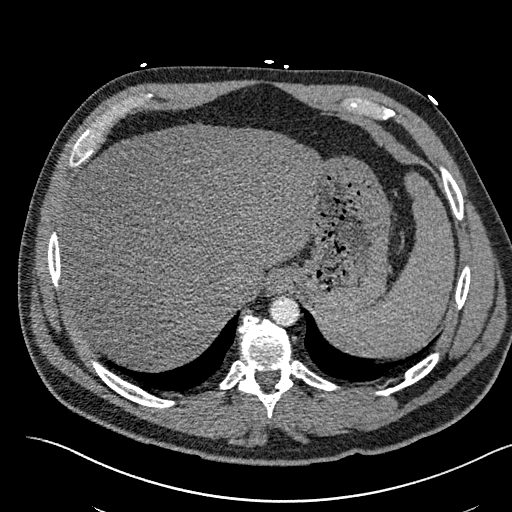
[im 107/319  lung]
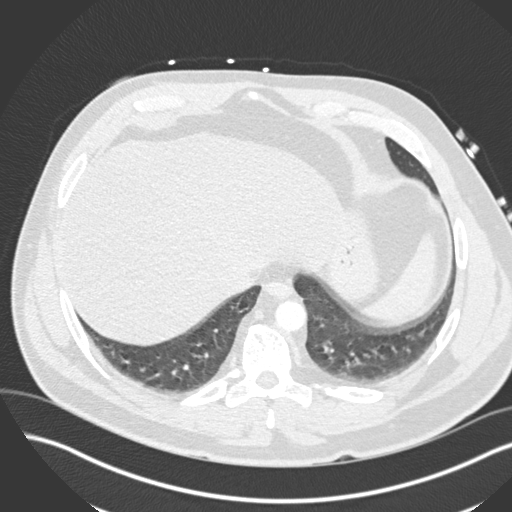
[im 124/319  soft-tissue]
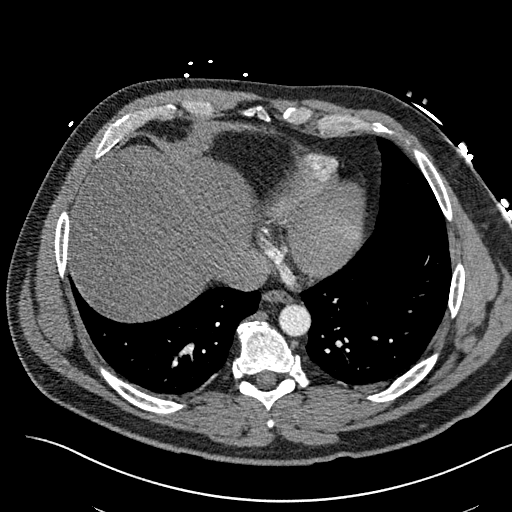
[im 142/319  lung]
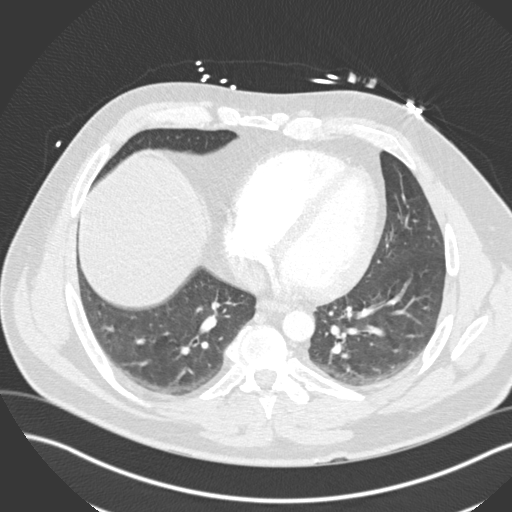
[im 177/319  soft-tissue]
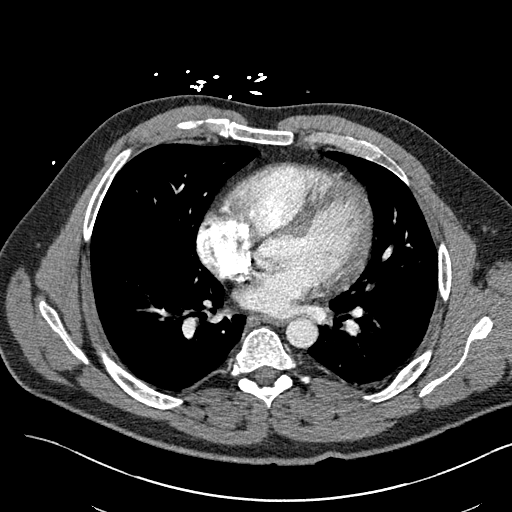
[im 195/319  lung]
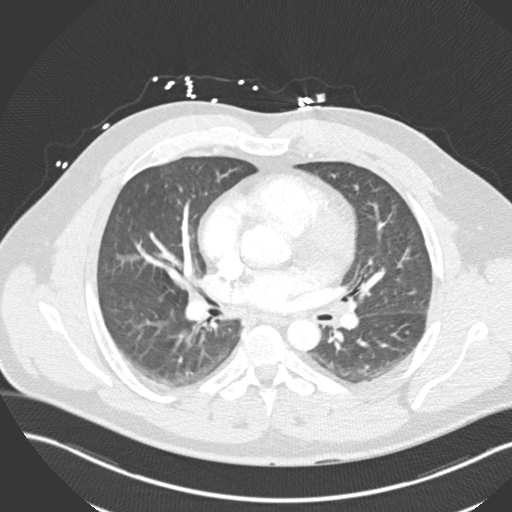
[im 213/319  soft-tissue]
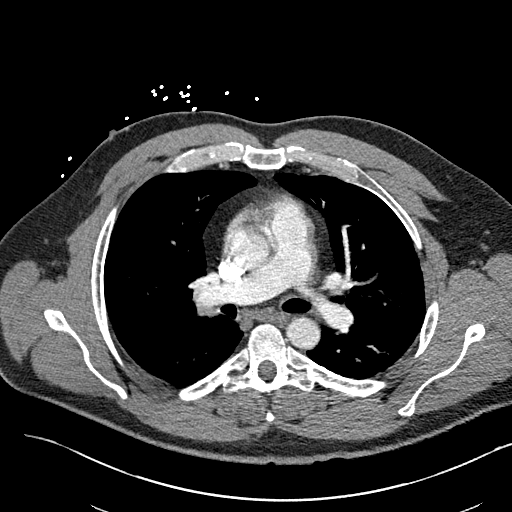
[im 230/319  lung]
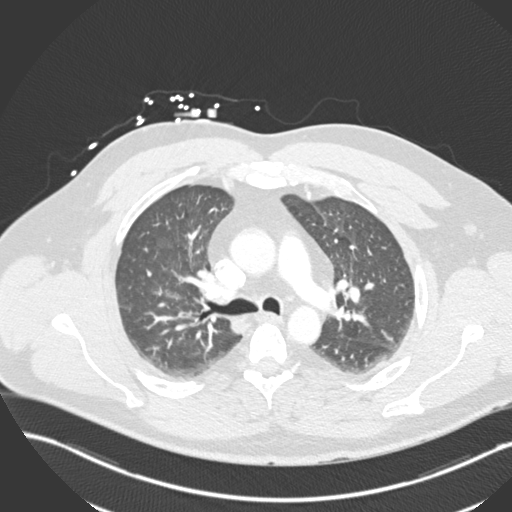
[im 248/319  soft-tissue]
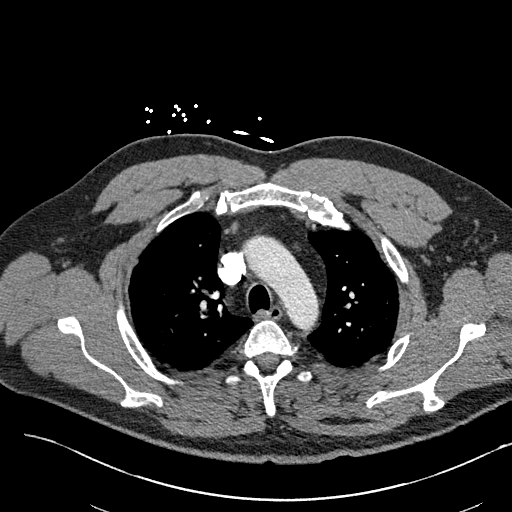
[im 283/319  lung]
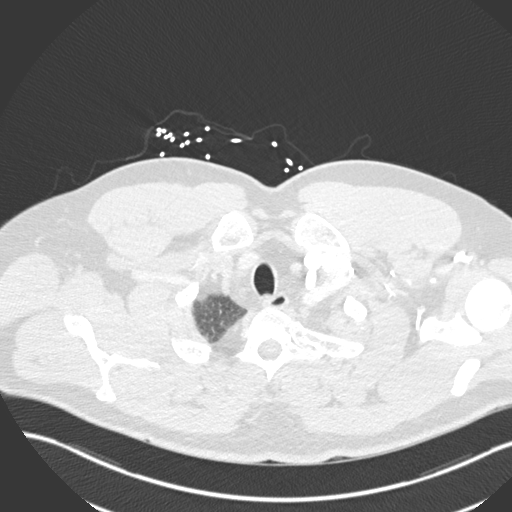
[im 301/319  soft-tissue]
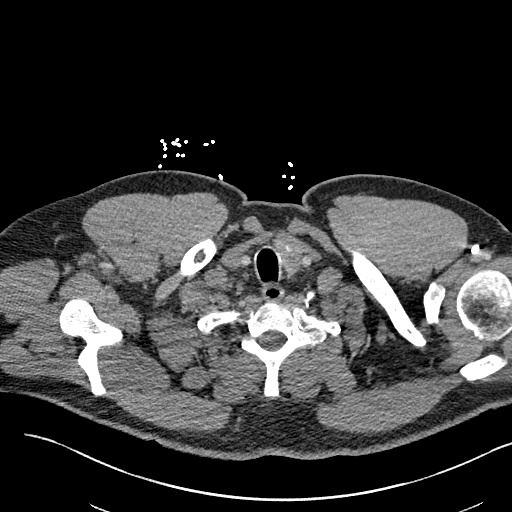

[Series 7: coronal mpr · coronal · 0.60mm/px · 3 of 144 slices shown]
[im 36/144  soft-tissue]
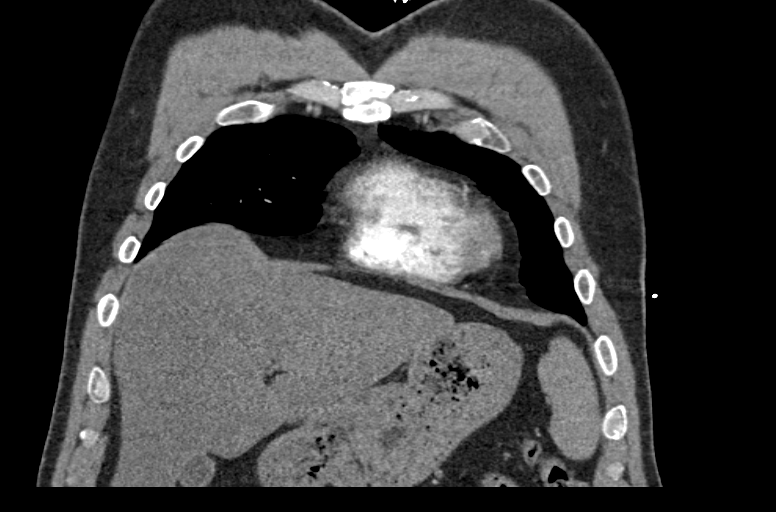
[im 72/144  soft-tissue]
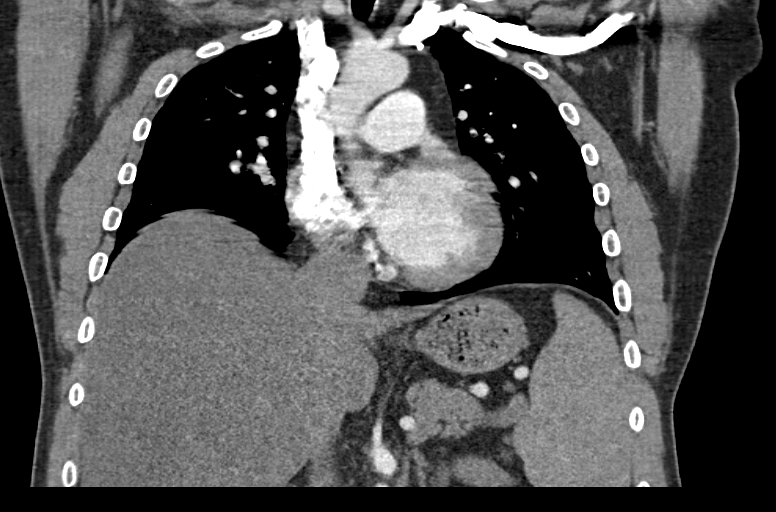
[im 108/144  soft-tissue]
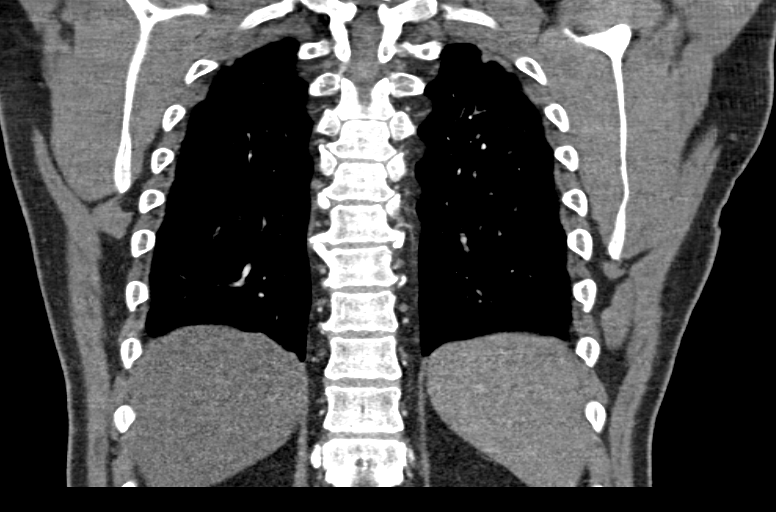

[17 of 46 positions shown; findings below may reference images not displayed]

FINDINGS: Left thyroid lobe nodule with associated calcification.
Postoperative changes of right thyroidectomy.

Suboptimal contrast bolus timing. No pulmonary embolism identified.
Some of the peripheral segmental and subsegmental branches are
poorly assessed due to contrast bolus timing and respiratory motion.

Normal caliber aorta and branch vessels.

Upper normal heart size. Trace pericardial fluid. No pleural
effusion.

No intrathoracic lymphadenopathy. Upper abdominal images show
hepatic steatosis and lobular contour. Splenomegaly is again noted.

Central airways are patent. No pneumothorax. Mild dependent
atelectasis. 5 mm right lower lobe pulmonary nodule, similar to
prior. Subpleural lingular nodule measuring 4 mm, similar to prior
allowing for differences in slice selection. 3 mm right upper lobe
nodule, unchanged.

Multilevel degenerative changes.  No acute osseous finding.

Review of the MIP images confirms the above findings.
IMPRESSION: Suboptimal contrast bolus timing and respiratory motion degrades
assessment of some of the segmental and subsegmental branches.
Within this limitation, no pulmonary embolism identified.

Pulmonary nodules are similar to prior and measure up to 5 mm.

Hepatic steatosis.

Nonspecific splenomegaly.

Indeterminate left thyroid lobe nodule can be further assessed with
ultrasound.

## 2018-01-09 ENCOUNTER — Ambulatory Visit (INDEPENDENT_AMBULATORY_CARE_PROVIDER_SITE_OTHER): Payer: Managed Care, Other (non HMO) | Admitting: Podiatry

## 2018-01-09 DIAGNOSIS — L600 Ingrowing nail: Secondary | ICD-10-CM | POA: Diagnosis not present

## 2018-01-09 MED ORDER — GENTAMICIN SULFATE 0.1 % EX CREA: 1.0000 "application " | TOPICAL_CREAM | Freq: Three times a day (TID) | CUTANEOUS | 1 refills | Status: AC

## 2018-01-09 NOTE — Patient Instructions (Signed)
Place 1/4 cup of epsom salts in a quart of warm tap water.  Submerge your foot or feet in the solution and soak for 20 minutes.  This soak should be done twice a day.  Next, remove your foot or feet from solution, blot dry the affected area. Apply ointment and cover if instructed by your doctor.   IF YOUR SKIN BECOMES IRRITATED WHILE USING THESE INSTRUCTIONS, IT IS OKAY TO SWITCH TO  WHITE VINEGAR AND WATER.  As another alternative soak, you may use antibacterial soap and water.  Monitor for any signs/symptoms of infection. Call the office immediately if any occur or go directly to the emergency room. Call with any questions/concerns.  Ingrown Toenail An ingrown toenail occurs when the corner or sides of your toenail grow into the surrounding skin. The big toe is most commonly affected, but it can happen to any of your toes. If your ingrown toenail is not treated, you will be at risk for infection. What are the causes? This condition may be caused by:  Wearing shoes that are too small or tight.  Injury or trauma, such as stubbing your toe or having your toe stepped on.  Improper cutting or care of your toenails.  Being born with (congenital) nail or foot abnormalities, such as having a nail that is too big for your toe.  What increases the risk? Risk factors for an ingrown toenail include:  Age. Your nails tend to thicken as you get older, so ingrown nails are more common in older people.  Diabetes.  Cutting your toenails incorrectly.  Blood circulation problems.  What are the signs or symptoms? Symptoms may include:  Pain, soreness, or tenderness.  Redness.  Swelling.  Hardening of the skin surrounding the toe.  Your ingrown toenail may be infected if there is fluid, pus, or drainage. How is this diagnosed? An ingrown toenail may be diagnosed by medical history and physical exam. If your toenail is infected, your health care provider may test a sample of the  drainage. How is this treated? Treatment depends on the severity of your ingrown toenail. Some ingrown toenails may be treated at home. More severe or infected ingrown toenails may require surgery to remove all or part of the nail. Infected ingrown toenails may also be treated with antibiotic medicines. Follow these instructions at home:  If you were prescribed an antibiotic medicine, finish all of it even if you start to feel better.  Soak your foot in warm soapy water for 20 minutes, 3 times per day or as directed by your health care provider.  Carefully lift the edge of the nail away from the sore skin by wedging a small piece of cotton under the corner of the nail. This may help with the pain. Be careful not to cause more injury to the area.  Wear shoes that fit well. If your ingrown toenail is causing you pain, try wearing sandals, if possible.  Trim your toenails regularly and carefully. Do not cut them in a curved shape. Cut your toenails straight across. This prevents injury to the skin at the corners of the toenail.  Keep your feet clean and dry.  If you are having trouble walking and are given crutches by your health care provider, use them as directed.  Do not pick at your toenail or try to remove it yourself.  Take medicines only as directed by your health care provider.  Keep all follow-up visits as directed by your health care provider. This   is important. Contact a health care provider if:  Your symptoms do not improve with treatment. Get help right away if:  You have red streaks that start at your foot and go up your leg.  You have a fever.  You have increased redness, swelling, or pain.  You have fluid, blood, or pus coming from your toenail. This information is not intended to replace advice given to you by your health care provider. Make sure you discuss any questions you have with your health care provider. Document Released: 09/17/2000 Document Revised:  02/20/2016 Document Reviewed: 08/14/2014 Elsevier Interactive Patient Education  2018 Elsevier Inc.  

## 2018-01-11 NOTE — Progress Notes (Signed)
   Subjective: 47 year old male with PMHx of T2DM presents today for evaluation of pain to the lateral border of the right great toe that began two weeks ago. He reports associated redness of the area. Patient is concerned for possible ingrown nail. Applying pressure to the area increases the pain. He was taking an antibiotic for the symptoms with no significant relief. Patient presents today for further treatment and evaluation.   Past Medical History:  Diagnosis Date  . Cellulitis 2003   R knee  . Coronary artery disease   . Diabetes mellitus without complication   . Hypertension   . Thyroid disease     Objective:  General: Well developed, nourished, in no acute distress, alert and oriented x3   Dermatology: Skin is warm, dry and supple bilateral. Lateral border of the right great toe appears to be erythematous with evidence of an ingrowing nail. Pain on palpation noted to the border of the nail fold. The remaining nails appear unremarkable at this time. There are no open sores, lesions.  Vascular: Dorsalis Pedis artery and Posterior Tibial artery pedal pulses palpable. No lower extremity edema noted.   Neruologic: Diminished via light touch bilateral.  Musculoskeletal: Muscular strength within normal limits in all groups bilateral. Normal range of motion noted to all pedal and ankle joints.   Assesement: #1 Paronychia with ingrowing nail lateral border right great toe #2 Pain in toe #3 Incurvated nail  Plan of Care:  1. Patient evaluated.  2. Discussed treatment alternatives and plan of care. Explained nail avulsion procedure and post procedure course to patient. 3. Patient opted for permanent partial nail avulsion.  4. Prior to procedure, local anesthesia infiltration utilized using 3 ml of a 50:50 mixture of 2% plain lidocaine and 0.5% plain marcaine in a normal hallux block fashion and a betadine prep performed.  5. Partial permanent nail avulsion with chemical matrixectomy  performed using [id number] applications of phenol followed by alcohol flush.  6. Light dressing applied. 7. Prescription for gentamicin cream provided to patient.  8. Return to clinic in 2 weeks.   Edrick Kins, DPM [facility]  Dr. Edrick Kins, [place]                                        [place], [place] [address]                Office [phone]  Fax [phone]

## 2018-01-23 ENCOUNTER — Encounter: Payer: Managed Care, Other (non HMO) | Admitting: Podiatry

## 2018-01-29 NOTE — Progress Notes (Signed)
This encounter was created in error - please disregard.

## 2018-04-07 ENCOUNTER — Other Ambulatory Visit: Payer: Self-pay | Admitting: Internal Medicine

## 2018-04-07 DIAGNOSIS — D34 Benign neoplasm of thyroid gland: Secondary | ICD-10-CM

## 2018-04-17 ENCOUNTER — Ambulatory Visit
Admission: RE | Admit: 2018-04-17 | Discharge: 2018-04-17 | Disposition: A | Discharge status: Home / Self Care | Payer: Managed Care, Other (non HMO) | Source: Ambulatory Visit | Attending: Internal Medicine | Admitting: Internal Medicine

## 2018-04-17 DIAGNOSIS — D34 Benign neoplasm of thyroid gland: Secondary | ICD-10-CM

## 2019-08-02 ENCOUNTER — Other Ambulatory Visit: Payer: Self-pay

## 2019-08-02 DIAGNOSIS — Z20822 Contact with and (suspected) exposure to covid-19: Secondary | ICD-10-CM

## 2019-08-03 LAB — NOVEL CORONAVIRUS, NAA: SARS-CoV-2, NAA: NOT DETECTED

## 2019-10-17 ENCOUNTER — Ambulatory Visit: Payer: 59 | Attending: Internal Medicine

## 2019-10-17 DIAGNOSIS — Z20822 Contact with and (suspected) exposure to covid-19: Secondary | ICD-10-CM

## 2019-10-18 LAB — NOVEL CORONAVIRUS, NAA: SARS-CoV-2, NAA: NOT DETECTED

## 2020-06-10 LAB — COLOGUARD: COLOGUARD: NEGATIVE

## 2020-06-17 NOTE — Progress Notes (Signed)
 Otolaryngology Clinic Note  HPI:    Kirk Lewis is a 49 y.o. male who presents as a new patient.  The patient presents for new patient consultation at the request of his primary care physician Dr. Clarice for evaluation of nasal airway obstruction, mouth breathing and chronic congestion.  The patient has a history of obstructive sleep apnea and wears nightly fullface humidified CPAP with good result, he wears on a consistent nightly basis.  He has a history of chronic nasal obstruction and is undergone prior nasal surgery, initially performed in 2007 by Dr. Floy with septoplasty and tonsillectomy.  Patient underwent revision surgery 2 years ago with repeat septoplasty and turbinate reduction.  He has continued to have difficulty with nasal breathing, increased nasal congestion which is worse at night and when exercising.  He has noisy breathing and postnasal discharge.  No history of sinusitis or infection.  He is concerned he may have seasonal allergies.  PMH/Meds/All/SocHx/FamHx/ROS:   Past Medical History:  Diagnosis Date   Diabetes mellitus (HCC)    Hypertension     Past Surgical History:  Procedure Laterality Date   SINUS SURGERY      No family history of bleeding disorders, wound healing problems or difficulty with anesthesia.   Social History   Socioeconomic History   Marital status: Unknown    Spouse name: Not on file   Number of children: Not on file   Years of education: Not on file   Highest education level: Not on file  Occupational History   Not on file  Tobacco Use   Smoking status: Never Smoker   Smokeless tobacco: Never Used  Vaping Use   Vaping Use: Never used  Substance and Sexual Activity   Alcohol use: Not Currently   Drug use: Not on file   Sexual activity: Not on file  Other Topics Concern   Not on file  Social History Narrative   Not on file   Social Determinants of Health   Financial Resource Strain:    Difficulty of Paying  Living Expenses:   Food Insecurity:    Worried About Running Out of Food in the Last Year:    Barista in the Last Year:   Transportation Needs:    Freight Forwarder (Medical):    Lack of Transportation (Non-Medical):   Physical Activity:    Days of Exercise per Week:    Minutes of Exercise per Session:   Stress:    Feeling of Stress :   Social Connections:    Frequency of Communication with Friends and Family:    Frequency of Social Gatherings with Friends and Family:    Attends Religious Services:    Active Member of Clubs or Organizations:    Attends Engineer, Structural:    Marital Status:      Current Outpatient Medications:    ACCU-CHEK SOFTCLIX LANCETS lancets, 1 application by abdominal subcutaneous route., Disp: , Rfl:    amLODIPine  (NORVASC ) 5 MG tablet, Take 5 mg by mouth., Disp: , Rfl:    aspirin  81 MG EC tablet *ANTIPLATELET*, Take 81 mg by mouth., Disp: , Rfl:    atorvastatin  (LIPITOR) 40 MG tablet, Take 20 mg by mouth., Disp: , Rfl:    levothyroxine  (SYNTHROID ) 75 MCG tablet, Take 75 mcg by mouth., Disp: , Rfl:    pantoprazole  (PROTONIX ) 40 MG tablet, Take 40 mg by mouth., Disp: , Rfl:    valsartan (DIOVAN) 80 MG tablet, Take 1 mg by mouth.,  Disp: , Rfl:    XIGDUO XR 5-1,000 mg TBph per tablet, Take 1 tablet by mouth., Disp: , Rfl:   A complete ROS was performed with pertinent positives/negatives noted in the HPI. The remainder of the ROS are negative.    Physical Exam:    BP 141/87   Temp 97.3 F (36.3 C)   Ht 1.829 m (6')   Wt 106.1 kg (234 lb)   BMI 31.74 kg/m   Constitutional:  Patient appears well-nourished and well-developed. No acute distress.   Head/Face: Facial features are symmetric. Skull is normocephalic. Hair and scalp are normal. Normal temporal artery pulses. TMJ shows no joint deformity swelling or erythema.   Eyes: Pupils are equal, round and reactive to light. Conjunctiva and lids are  normal. Normal extraocular mobility. Normal vision by patient report.   Ears:     Right: Pinna and external meatus normal, normal ear canal skin and caliber without excessive cerumen or drainage. Tympanic membranes intact without effusion or infection. Hearing normal.    Left: Pinna and external meatus normal, normal ear canal skin and caliber without excessive cerumen or drainage. Tympanic membranes intact without effusion or infection. Hearing normal.   Nose/Sinus/Nasopharynx: Relatively midline septum after prior surgery, no obstruction.  Turbinates are lateralized with moderate erythema of the mucosa.  The patient has bilateral nasal valve collapse on inspiration Normal middle and superior turbinates, sinus ostia patent without obstruction, mass or discharge. Nasopharynx patent.   Oral cavity/Oropharynx: Lips normal, teeth and gums normal with good dentition, normal oral vestibule. Normal floor of mouth, tongue and oral mucosa, no mucosal lesions, ulcer or mass, normal tongue mobility.  Hard and soft palate normal with normal mobility. Status post tonsillectomy, no swelling or abnormal tissue. Base of tongue, retromolar trigone and oral pharynx normal. Normal sensation, mobility and gag.   Neck: No cervical lymphadenopathy, mass or swelling. Salivary glands normal to palpation without swelling, erythema or mass. Normal facial nerve function. Normal thyroid  gland palpation.   Neurological: Alert and oriented to self, place and time.  Normal reflexes and motor skills, balance and coordination.   Psychiatric: No unusual anxiety or evidence of depression. Appropriate affect.      Independent Review of Additional Tests or Records:  None  Procedures:  None   Impression & Plans:   Patient presents for consultation at the request of his primary care physician with a history of chronic nasal airway obstruction.  His exam shows midline nasal septum after prior nasal septal  surgery.  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  Recommend medical therapy-saline nasal spray, fluticasone nightly and as needed daily antihistamines.  Expect gradual improvement in symptoms.  The patient will continue with humidified fullface CPAP as prescribed.  If he continues to have significant nasal airway obstruction he may be a candidate for evaluation by Dr. Redell Dural at [facility] for treatment of nasal valve collapse.  Monitor for additional symptoms and follow-up with me as needed. Alm CROME. Mable, M.D. [facility]      Electronically signed by: Alm Cecille Mable, MD 06/17/20 [phone]

## 2020-08-04 ENCOUNTER — Other Ambulatory Visit: Payer: 59

## 2020-08-04 ENCOUNTER — Other Ambulatory Visit: Payer: Self-pay

## 2020-08-04 DIAGNOSIS — Z20822 Contact with and (suspected) exposure to covid-19: Secondary | ICD-10-CM

## 2020-08-05 LAB — NOVEL CORONAVIRUS, NAA: SARS-CoV-2, NAA: NOT DETECTED

## 2020-08-05 LAB — SARS-COV-2, NAA 2 DAY TAT

## 2020-09-25 ENCOUNTER — Other Ambulatory Visit: Payer: 59

## 2020-09-25 DIAGNOSIS — Z20822 Contact with and (suspected) exposure to covid-19: Secondary | ICD-10-CM

## 2020-09-28 LAB — NOVEL CORONAVIRUS, NAA: SARS-CoV-2, NAA: DETECTED — AB

## 2020-09-29 ENCOUNTER — Telehealth: Payer: Self-pay | Admitting: Infectious Diseases

## 2020-09-29 NOTE — Telephone Encounter (Signed)
Called to Discuss with patient about Covid symptoms and the use of the monoclonal antibody infusion for those with mild to moderate Covid symptoms and at a high risk of hospitalization.     Pt appears to qualify for this infusion due to co-morbid conditions and/or a member of an at-risk group in accordance with the FDA Emergency Use Authorization.    Unable to reach pt - LVM and MyChart message   Symptom onset: unknown Vaccinated: unknown Qualified for Infusion: BMI > [place], MSN, NP-C [facility] [email or web address] Pager: [phone] Office: [phone] [place]: [phone]

## 2024-09-20 ENCOUNTER — Other Ambulatory Visit: Payer: Self-pay | Admitting: Urology

## 2024-10-15 NOTE — Progress Notes (Signed)
 COVID Vaccine received:  [x]  No []  Yes Date of any COVID positive Test in last 90 days: none PCP - Ryan Hives MD Cardiologist - n/a  Chest x-ray -  EKG -   Stress Test - 12/22/14 Epic ECHO -  Cardiac Cath -   Bowel Prep - [x]  No  []   Yes ______  Pacemaker / ICD device [x]  No []  Yes   Spinal Cord Stimulator:[x]  No []  Yes       History of Sleep Apnea? []  No [x]  Yes   CPAP used?- []  No [x]  Yes    Does the patient monitor blood sugar?          []  No [x]  Yes  []  N/A  Patient has: []  NO Hx DM   []  Pre-DM                 []  DM1  [x]   DM2 Does patient have a [employer] or Dexacom? [x]  No []  Yes   Fasting Blood Sugar Ranges- 150's Checks Blood Sugar ___1__ times a week  GLP1 agonist / usual dose - Ozempic-last dose 10/09/24 GLP1 instructions:  SGLT-2 inhibitors / usual dose - Xigduo hold for 72 hours. Last dose to be 10/20/24. SGLT-2 instructions:   Blood Thinner / Instructions:no Aspirin  Instructions:no  Comments:   Activity level: Patient is able to climb a flight of stairs without difficulty; [x]  No CP  [x]  No SOB,  Patient can perform ADLs without assistance.   Anesthesia review:   Patient denies shortness of breath, fever, cough and chest pain at PAT appointment.  Patient verbalized understanding and agreement to the Pre-Surgical Instructions that were given to them at this PAT appointment. Patient was also educated of the need to review these PAT instructions again prior to his/her surgery.I reviewed the appropriate phone numbers to call if they have any and questions or concerns.

## 2024-10-15 NOTE — Patient Instructions (Signed)
 SURGICAL WAITING ROOM VISITATION  Patients having surgery or a procedure may have no more than 2 support people in the waiting area - these visitors may rotate.    Children ages 35 and under will not be able to visit patients in [facility] under most circumstances.   Visitors with respiratory illnesses are discouraged from visiting and should remain at home.  If the patient needs to stay at the hospital during part of their recovery, the visitor guidelines for inpatient rooms apply. Pre-op nurse will coordinate an appropriate time for 1 support person to accompany patient in pre-op.  This support person may not rotate.    Please refer to the [facility] website for the visitor guidelines for Inpatients (after your surgery is over and you are in a regular room).       Your procedure is scheduled on: 10/24/24   Report to [facility] Main Entrance    Report to admitting at 6:15 AM   Call this number if you have problems the morning of surgery [phone]   Do not eat food or drink liquids:After Midnight.but may have sips of water to take meds.     Oral Hygiene is also important to reduce your risk of infection.                                    Remember - BRUSH YOUR TEETH THE MORNING OF SURGERY WITH YOUR REGULAR TOOTHPASTE   Stop all vitamins and herbal supplements 7 days before surgery.   Take these medicines the morning of surgery with A SIP OF WATER: tylenol , atorvastatin , bupropion, nasal spray, levothyroxine , pantoprazole .              Do not take Valsartan/hydrochlorothiazide (diovan) the morning of surgery.  DO NOT TAKE ANY ORAL DIABETIC MEDICATIONS DAY OF YOUR SURGERY Hold Xigduo for 72 hours prior to surgery. Last dose to be 10/20/24.  Bring CPAP mask and tubing day of surgery.                              You may not have any metal on your body including hair pins, jewelry, and body piercing             Do not wear lotions, powders, perfumes/cologne,  or deodorant              Men may shave face and neck.   Do not bring valuables to the hospital. [facility] IS NOT             RESPONSIBLE   FOR VALUABLES.   Contacts, glasses, dentures or bridgework may not be worn into surgery.   DO NOT BRING YOUR HOME MEDICATIONS TO THE HOSPITAL. PHARMACY WILL DISPENSE MEDICATIONS LISTED ON YOUR MEDICATION LIST TO YOU DURING YOUR ADMISSION IN THE HOSPITAL!    Patients discharged on the day of surgery will not be allowed to drive home.  Someone NEEDS to stay with you for the first 24 hours after anesthesia.   Special Instructions: Bring a copy of your healthcare power of attorney and living will documents the day of surgery if you haven't scanned them before.              Please read over the following fact sheets you were given: IF YOU HAVE QUESTIONS ABOUT YOUR PRE-OP INSTRUCTIONS PLEASE [phone] [place]  If you received a COVID test during your pre-op visit  it is requested that you wear a mask when out in public, stay away from anyone that may not be feeling well and notify your surgeon if you develop symptoms. If you test positive for Covid or have been in contact with anyone that has tested positive in the last 10 days please notify you surgeon.    [place] - Preparing for Surgery Before surgery, you can play an important role.  Because skin is not sterile, your skin needs to be as free of germs as possible.  You can reduce the number of germs on your skin by washing with CHG (chlorahexidine gluconate) soap before surgery.  CHG is an antiseptic cleaner which kills germs and bonds with the skin to continue killing germs even after washing. Please DO NOT use if you have an allergy to CHG or antibacterial soaps.  If your skin becomes reddened/irritated stop using the CHG and inform your nurse when you arrive at Short Stay. Do not shave (including legs and underarms) for at least 48 hours prior to the first CHG shower.  You may shave your  face/neck.  Please follow these instructions carefully:  1.  Shower with CHG Soap the night before surgery and the morning of surgery.  2.  If you choose to wash your hair, wash your hair first as usual with your normal  shampoo.  3.  After you shampoo, rinse your hair and body thoroughly to remove the shampoo.                             4.  Use CHG as you would any other liquid soap.  You can apply chg directly to the skin and wash.  Gently with a scrungie or clean washcloth.  5.  Apply the CHG Soap to your body ONLY FROM THE NECK DOWN.   Do   not use on face/ open                           Wound or open sores. Avoid contact with eyes, ears mouth and   genitals (private parts).                       Wash face,  Genitals (private parts) with your normal soap.             6.  Wash thoroughly, paying special attention to the area where your    surgery  will be performed.  7.  Thoroughly rinse your body with warm water from the neck down.  8.  DO NOT shower/wash with your normal soap after using and rinsing off the CHG Soap.                9.  Pat yourself dry with a clean towel.            10.  Wear clean pajamas.            11.  Place clean sheets on your bed the night of your first shower and do not  sleep with pets. Day of Surgery : Do not apply any CHG, lotions/deodorants the morning of surgery.  Please wear clean clothes to the hospital/surgery center.  FAILURE TO FOLLOW THESE INSTRUCTIONS MAY RESULT IN THE CANCELLATION OF YOUR SURGERY  PATIENT SIGNATURE_________________________________  NURSE SIGNATURE__________________________________  ________________________________________________________________________

## 2024-10-16 ENCOUNTER — Encounter (HOSPITAL_COMMUNITY)
Admission: RE | Admit: 2024-10-16 | Discharge: 2024-10-16 | Disposition: A | Source: Ambulatory Visit | Attending: Urology

## 2024-10-16 ENCOUNTER — Other Ambulatory Visit: Payer: Self-pay

## 2024-10-16 ENCOUNTER — Encounter (HOSPITAL_COMMUNITY): Payer: Self-pay

## 2024-10-16 VITALS — BP 133/91 | HR 85 | Temp 98.0°F | Resp 16 | Ht 72.0 in | Wt 220.0 lb

## 2024-10-16 DIAGNOSIS — E119 Type 2 diabetes mellitus without complications: Secondary | ICD-10-CM | POA: Insufficient documentation

## 2024-10-16 DIAGNOSIS — I10 Essential (primary) hypertension: Secondary | ICD-10-CM | POA: Insufficient documentation

## 2024-10-16 DIAGNOSIS — Z01818 Encounter for other preprocedural examination: Secondary | ICD-10-CM | POA: Diagnosis present

## 2024-10-16 HISTORY — DX: Personal history of urinary calculi: Z87.442

## 2024-10-16 HISTORY — DX: Hypothyroidism, unspecified: E03.9

## 2024-10-16 LAB — CBC
HCT: 45.8 % (ref 39.0–52.0)
Hemoglobin: 16.4 g/dL (ref 13.0–17.0)
MCH: 29.6 pg (ref 26.0–34.0)
MCHC: 35.8 g/dL (ref 30.0–36.0)
MCV: 82.7 fL (ref 80.0–100.0)
Platelets: 172 K/uL (ref 150–400)
RBC: 5.54 MIL/uL (ref 4.22–5.81)
RDW: 13.2 % (ref 11.5–15.5)
WBC: 6.1 K/uL (ref 4.0–10.5)
nRBC: 0 % (ref 0.0–0.2)

## 2024-10-16 LAB — BASIC METABOLIC PANEL WITH GFR
Anion gap: 11 (ref 5–15)
BUN: 15 mg/dL (ref 6–20)
CO2: 26 mmol/L (ref 22–32)
Calcium: 9.5 mg/dL (ref 8.9–10.3)
Chloride: 101 mmol/L (ref 98–111)
Creatinine, Ser: 1.05 mg/dL (ref 0.61–1.24)
GFR, Estimated: >60 mL/min
Glucose, Bld: 228 mg/dL — ABNORMAL HIGH (ref 70–99)
Potassium: 3.5 mmol/L (ref 3.5–5.1)
Sodium: 138 mmol/L (ref 135–145)

## 2024-10-16 LAB — HEMOGLOBIN A1C
Hgb A1c MFr Bld: 8.6 % — ABNORMAL HIGH (ref 4.8–5.6)
Mean Plasma Glucose: 200.12 mg/dL

## 2024-10-16 LAB — GLUCOSE, CAPILLARY: Glucose-Capillary: 206 mg/dL — ABNORMAL HIGH (ref 70–99)

## 2024-10-18 NOTE — Progress Notes (Signed)
 Request routed to Dr. Lovie to review pt's pre op A1c from 10/16/24.

## 2024-10-24 ENCOUNTER — Encounter (HOSPITAL_COMMUNITY)
Admission: RE | Disposition: A | Discharge status: Home / Self Care | Payer: Self-pay | Source: Ambulatory Visit | Attending: Urology

## 2024-10-24 ENCOUNTER — Other Ambulatory Visit: Payer: Self-pay

## 2024-10-24 ENCOUNTER — Ambulatory Visit (HOSPITAL_COMMUNITY)

## 2024-10-24 ENCOUNTER — Ambulatory Visit (HOSPITAL_COMMUNITY)
Admission: RE | Admit: 2024-10-24 | Discharge: 2024-10-24 | Disposition: A | Discharge status: Home / Self Care | Source: Ambulatory Visit | Attending: Urology | Admitting: Urology

## 2024-10-24 ENCOUNTER — Encounter (HOSPITAL_COMMUNITY): Payer: Self-pay | Admitting: Urology

## 2024-10-24 DIAGNOSIS — N486 Induration penis plastica: Secondary | ICD-10-CM | POA: Insufficient documentation

## 2024-10-24 DIAGNOSIS — E119 Type 2 diabetes mellitus without complications: Secondary | ICD-10-CM

## 2024-10-24 DIAGNOSIS — I10 Essential (primary) hypertension: Secondary | ICD-10-CM | POA: Insufficient documentation

## 2024-10-24 DIAGNOSIS — Z7985 Long-term (current) use of injectable non-insulin antidiabetic drugs: Secondary | ICD-10-CM | POA: Diagnosis not present

## 2024-10-24 DIAGNOSIS — E039 Hypothyroidism, unspecified: Secondary | ICD-10-CM | POA: Insufficient documentation

## 2024-10-24 LAB — GLUCOSE, CAPILLARY
Glucose-Capillary: 334 mg/dL — ABNORMAL HIGH (ref 70–99)
Glucose-Capillary: 308 mg/dL — ABNORMAL HIGH (ref 70–99)
Glucose-Capillary: 265 mg/dL — ABNORMAL HIGH (ref 70–99)

## 2024-10-24 MED ORDER — KETOROLAC TROMETHAMINE 30 MG/ML IJ SOLN: INTRAMUSCULAR | Status: DC | PRN

## 2024-10-24 MED ORDER — HYDROMORPHONE HCL 1 MG/ML IJ SOLN
INTRAMUSCULAR | Status: AC
  Filled 2024-10-24: qty 1

## 2024-10-24 MED ORDER — MIDAZOLAM HCL 2 MG/2ML IJ SOLN
INTRAMUSCULAR | Status: AC
  Filled 2024-10-24: qty 2

## 2024-10-24 MED ORDER — AMISULPRIDE (ANTIEMETIC) 5 MG/2ML IV SOLN: 10.0000 mg | Freq: Once | INTRAVENOUS | Status: DC | PRN

## 2024-10-24 MED ORDER — BUPIVACAINE HCL (PF) 0.5 % IJ SOLN
INTRAMUSCULAR | Status: AC
  Filled 2024-10-24: qty 30

## 2024-10-24 MED ORDER — DEXAMETHASONE SOD PHOSPHATE PF 10 MG/ML IJ SOLN: INTRAMUSCULAR | Status: DC | PRN

## 2024-10-24 MED ORDER — MEPERIDINE HCL 25 MG/ML IJ SOLN: 6.2500 mg | INTRAMUSCULAR | Status: DC | PRN

## 2024-10-24 MED ORDER — OXYCODONE HCL 5 MG/5ML PO SOLN: 5.0000 mg | Freq: Once | ORAL | Status: DC | PRN

## 2024-10-24 MED ORDER — FENTANYL CITRATE (PF) 100 MCG/2ML IJ SOLN
INTRAMUSCULAR | Status: AC
  Filled 2024-10-24: qty 2

## 2024-10-24 MED ORDER — ORAL CARE MOUTH RINSE: 15.0000 mL | Freq: Once | OROMUCOSAL | Status: AC

## 2024-10-24 MED ORDER — KETOROLAC TROMETHAMINE 30 MG/ML IJ SOLN
INTRAMUSCULAR | Status: AC
  Filled 2024-10-24: qty 1

## 2024-10-24 MED ORDER — DEXMEDETOMIDINE HCL IN NACL 80 MCG/20ML IV SOLN
INTRAVENOUS | Status: AC
  Filled 2024-10-24: qty 20

## 2024-10-24 MED ORDER — BUPIVACAINE HCL 0.5 % IJ SOLN
INTRAMUSCULAR | Status: DC | PRN
  Administered 2024-10-24: 5 mL

## 2024-10-24 MED ORDER — LIDOCAINE HCL (PF) 2 % IJ SOLN
INTRAMUSCULAR | Status: DC | PRN
  Administered 2024-10-24: 80 mg via INTRADERMAL

## 2024-10-24 MED ORDER — LIDOCAINE HCL (PF) 2 % IJ SOLN
INTRAMUSCULAR | Status: AC
  Filled 2024-10-24: qty 5

## 2024-10-24 MED ORDER — DEXAMETHASONE SOD PHOSPHATE PF 10 MG/ML IJ SOLN
INTRAMUSCULAR | Status: AC
  Filled 2024-10-24: qty 1

## 2024-10-24 MED ORDER — ACETAMINOPHEN 500 MG PO TABS: 1000.0000 mg | ORAL_TABLET | Freq: Three times a day (TID) | ORAL | 0 refills | Status: AC | PRN

## 2024-10-24 MED ORDER — OXYCODONE HCL 5 MG PO TABS: 5.0000 mg | ORAL_TABLET | Freq: Once | ORAL | Status: DC | PRN

## 2024-10-24 MED ORDER — CHLORHEXIDINE GLUCONATE 0.12 % MT SOLN
15.0000 mL | Freq: Once | OROMUCOSAL | Status: AC
  Administered 2024-10-24: 15 mL via OROMUCOSAL

## 2024-10-24 MED ORDER — CEFAZOLIN SODIUM-DEXTROSE 2-4 GM/100ML-% IV SOLN
2.0000 g | INTRAVENOUS | Status: AC
  Administered 2024-10-24: 2 g via INTRAVENOUS
  Filled 2024-10-24: qty 100

## 2024-10-24 MED ORDER — LIDOCAINE HCL 1 % IJ SOLN
INTRAMUSCULAR | Status: DC | PRN
  Administered 2024-10-24: 5 mL

## 2024-10-24 MED ORDER — CELECOXIB 200 MG PO CAPS: 200.0000 mg | ORAL_CAPSULE | Freq: Two times a day (BID) | ORAL | 1 refills | Status: AC

## 2024-10-24 MED ORDER — PROPOFOL 500 MG/50ML IV EMUL
INTRAVENOUS | Status: AC
  Filled 2024-10-24: qty 50

## 2024-10-24 MED ORDER — INSULIN ASPART 100 UNIT/ML IJ SOLN
0.0000 [IU] | INTRAMUSCULAR | Status: DC | PRN
  Administered 2024-10-24: 10 [IU] via SUBCUTANEOUS

## 2024-10-24 MED ORDER — MIDAZOLAM HCL (PF) 2 MG/2ML IJ SOLN
INTRAMUSCULAR | Status: DC | PRN
  Administered 2024-10-24: 2 mg via INTRAVENOUS

## 2024-10-24 MED ORDER — INSULIN ASPART 100 UNIT/ML IJ SOLN
INTRAMUSCULAR | Status: AC
  Filled 2024-10-24: qty 10

## 2024-10-24 MED ORDER — LIDOCAINE HCL (PF) 1 % IJ SOLN
INTRAMUSCULAR | Status: AC
  Filled 2024-10-24: qty 30

## 2024-10-24 MED ORDER — DEXMEDETOMIDINE HCL IN NACL 80 MCG/20ML IV SOLN
INTRAVENOUS | Status: DC | PRN
  Administered 2024-10-24: 10 ug via INTRAVENOUS

## 2024-10-24 MED ORDER — LACTATED RINGERS IV SOLN: INTRAVENOUS | Status: DC

## 2024-10-24 MED ORDER — ONDANSETRON HCL 4 MG/2ML IJ SOLN
INTRAMUSCULAR | Status: DC | PRN
  Administered 2024-10-24: 4 mg via INTRAVENOUS

## 2024-10-24 MED ORDER — HYDROMORPHONE HCL 1 MG/ML IJ SOLN
0.2500 mg | INTRAMUSCULAR | Status: DC | PRN
  Administered 2024-10-24: 0.5 mg via INTRAVENOUS

## 2024-10-24 MED ORDER — FENTANYL CITRATE (PF) 100 MCG/2ML IJ SOLN
INTRAMUSCULAR | Status: DC | PRN
  Administered 2024-10-24: 50 ug via INTRAVENOUS
  Administered 2024-10-24: 100 ug via INTRAVENOUS

## 2024-10-24 MED ORDER — ONDANSETRON HCL 4 MG/2ML IJ SOLN
INTRAMUSCULAR | Status: AC
  Filled 2024-10-24: qty 2

## 2024-10-24 MED ORDER — PROPOFOL 10 MG/ML IV BOLUS
INTRAVENOUS | Status: DC | PRN
  Administered 2024-10-24: 200 mg via INTRAVENOUS

## 2024-10-24 MED ORDER — TRAMADOL HCL 50 MG PO TABS: 50.0000 mg | ORAL_TABLET | Freq: Four times a day (QID) | ORAL | 0 refills | Status: AC | PRN

## 2024-10-24 MED ORDER — PHENYLEPHRINE 80 MCG/ML (10ML) SYRINGE FOR IV PUSH (FOR BLOOD PRESSURE SUPPORT)
PREFILLED_SYRINGE | INTRAVENOUS | Status: DC | PRN
  Administered 2024-10-24 (×2): 80 ug via INTRAVENOUS
  Administered 2024-10-24 (×3): 160 ug via INTRAVENOUS
  Administered 2024-10-24 (×3): 80 ug via INTRAVENOUS

## 2024-10-24 NOTE — Transfer of Care (Signed)
 Immediate Anesthesia Transfer of Care Note  Patient: Kirk Lewis  Procedure(s) Performed: PLICATION, PENIS  Patient Location: PACU  Anesthesia Type:General  Level of Consciousness: drowsy  Airway & Oxygen Therapy: Patient Spontanous Breathing and Patient connected to nasal cannula oxygen  Post-op Assessment: Report given to RN and Post -op Vital signs reviewed and stable  Post vital signs: Reviewed and stable  Last Vitals:  Vitals Value Taken Time  BP 134/94 10/24/24 10:36  Temp    Pulse 80 10/24/24 10:38  Resp 15 10/24/24 10:38  SpO2 97 % 10/24/24 10:38  Vitals shown include unfiled device data.  Last Pain:  Vitals:   10/24/24 0642  TempSrc:   PainSc: 0-No pain      Patients Stated Pain Goal: 5 (10/24/24 9357)  Complications: No notable events documented.

## 2024-10-24 NOTE — Anesthesia Procedure Notes (Signed)
 Procedure Name: LMA Insertion Date/Time: 10/24/2024 8:50 AM  Performed by: Lenard Lacks, CRNAPre-anesthesia Checklist: Patient identified, Emergency Drugs available, Suction available and Patient being monitored Patient Re-evaluated:Patient Re-evaluated prior to induction Oxygen Delivery Method: Circle system utilized Preoxygenation: Pre-oxygenation with 100% oxygen Induction Type: IV induction LMA: LMA inserted LMA Size: 5.0 Number of attempts: 1 Placement Confirmation: positive ETCO2 and breath sounds checked- equal and bilateral Dental Injury: Teeth and Oropharynx as per pre-operative assessment

## 2024-10-24 NOTE — H&P (Signed)
 H&P  History of Present Illness: Kirk Lewis is a 54 y.o. year old M who presents today for penile plication  No acute complaints  Past Medical History:  Diagnosis Date   Cellulitis 10/04/2001   R knee   Coronary artery disease    Diabetes mellitus without complication (HCC)    History of kidney stones    Hypertension    Hypothyroidism    Thyroid  disease     Past Surgical History:  Procedure Laterality Date   THYROID  SURGERY      Home Medications:  Active Medications[1]  Allergies: Allergies[2]  History reviewed. No pertinent family history.  Social History:  reports that he has never smoked. He has never been exposed to tobacco smoke. He has never used smokeless tobacco. He reports that he does not drink alcohol and does not use drugs.  ROS: A complete review of systems was performed.  All systems are negative except for pertinent findings as noted.  Physical Exam:  Vital signs in last 24 hours: Temp:  [97.7 F (36.5 C)] 97.7 F (36.5 C) (01/21 0630) Pulse Rate:  [81] 81 (01/21 0630) Resp:  [16] 16 (01/21 0630) BP: (129)/(89) 129/89 (01/21 0630) SpO2:  [98 %] 98 % (01/21 0630) Weight:  [99.8 kg] 99.8 kg (01/21 9357) Constitutional:  Alert and oriented, No acute distress Cardiovascular: Regular rate and rhythm Respiratory: Normal respiratory effort, Lungs clear bilaterally GI: Abdomen is soft, nontender, nondistended, no abdominal masses Lymphatic: No lymphadenopathy Neurologic: Grossly intact, no focal deficits Psychiatric: Normal mood and affect   Laboratory Data:  No results for input(s): WBC, HGB, HCT, PLT in the last 72 hours.  No results for input(s): NA, K, CL, GLUCOSE, BUN, CALCIUM , CREATININE in the last 72 hours.  Invalid input(s): CO3   Results for orders placed or performed during the hospital encounter of 10/24/24 (from the past 24 hours)  Glucose, capillary     Status: Abnormal   Collection Time: 10/24/24  6:59  AM  Result Value Ref Range   Glucose-Capillary 334 (H) 70 - 99 mg/dL   Comment 1 Notify RN    Comment 2 Document in Chart    No results found for this or any previous visit (from the past 240 hours).  Renal Function: No results for input(s): CREATININE in the last 168 hours. Estimated Creatinine Clearance: 98.4 mL/min (by C-G formula based on SCr of 1.05 mg/dL).  Radiologic Imaging: No results found.  Assessment:  Kirk Lewis is a 54 y.o. year old M with Peyronie's disease  Plan:  To OR as planned for plication. Procedure and risks reviewed, including but not limited to bleeding, infection, wound healing issues, damage to adjacent structures, pain, recurrence, failure. We also discussed given his recent A1C he is at a higher risk of wound related issues/infection. Patient voiced understanding and still wished to proceed. All questions answered   Herlene Foot, MD 10/24/2024, 8:31 AM  [facility] Pager: [phone]      [1]  Current Meds  Medication Sig   acetaminophen  (TYLENOL ) 500 MG tablet Take 500 mg by mouth every 6 (six) hours as needed for headache.   amLODipine  (NORVASC ) 5 MG tablet Take 5 mg by mouth daily.   atorvastatin  (LIPITOR) 40 MG tablet Take 20 mg by mouth daily.   bismuth subsalicylate (PEPTO BISMOL) 262 MG/15ML suspension Take 30 mLs by mouth every 6 (six) hours as needed for indigestion or diarrhea or loose stools.   buPROPion (WELLBUTRIN XL) 300 MG  24 hr tablet Take 300 mg by mouth every morning.   ibuprofen  (ADVIL ) 200 MG tablet Take 600 mg by mouth every 6 (six) hours as needed for headache.   levothyroxine  (SYNTHROID , LEVOTHROID) 75 MCG tablet Take 75 mcg by mouth daily before breakfast.   OVER THE COUNTER MEDICATION Take 2 capsules by mouth daily. Nitric Oxide Snap Supplement   OZEMPIC, 2 MG/DOSE, 8 MG/3ML SOPN Inject 2 mg into the skin once a week.   pantoprazole  (PROTONIX ) 40 MG tablet Take 40 mg by mouth daily.   tadalafil  (CIALIS) 20 MG tablet Take 10 mg by mouth daily as needed for erectile dysfunction.   tadalafil (CIALIS) 5 MG tablet Take 5 mg by mouth daily.   valsartan-hydrochlorothiazide  (DIOVAN-HCT) 320-12.5 MG tablet Take 1 tablet by mouth daily.   XIGDUO XR 02-999 MG TB24 Take 2 tablets by mouth daily.  [2]  Allergies Allergen Reactions   Aspirin  Swelling    Lips/ eyes   Lisinopril  Anaphylaxis, Dermatitis and Swelling    FEET, EYES ,TONGUE

## 2024-10-24 NOTE — Anesthesia Postprocedure Evaluation (Signed)
"   Anesthesia Post Note  Patient: Kirk Lewis  Procedure(s) Performed: PLICATION, PENIS     Patient location during evaluation: PACU Anesthesia Type: General Level of consciousness: awake and alert Pain management: pain level controlled Vital Signs Assessment: post-procedure vital signs reviewed and stable Respiratory status: spontaneous breathing, nonlabored ventilation and respiratory function stable Cardiovascular status: blood pressure returned to baseline and stable Postop Assessment: no apparent nausea or vomiting Anesthetic complications: no   No notable events documented.  Last Vitals:  Vitals:   10/24/24 1115 10/24/24 1130  BP: (!) 137/95 (!) 137/90  Pulse: 75 77  Resp: 11 10  Temp:    SpO2: 98% 98%    Last Pain:  Vitals:   10/24/24 1130  TempSrc:   PainSc: 4                  Butler Levander Pinal      "

## 2024-10-24 NOTE — Anesthesia Preprocedure Evaluation (Addendum)
"                                    Anesthesia Evaluation  Patient identified by MRN, date of birth, ID band Patient awake    Reviewed: Allergy & Precautions, H&P , NPO status , Patient's Chart, lab work & pertinent test results  Airway Mallampati: II  TM Distance: >3 FB Neck ROM: Full    Dental no notable dental hx.    Pulmonary neg pulmonary ROS   Pulmonary exam normal breath sounds clear to auscultation       Cardiovascular hypertension, Pt. on medications negative cardio ROS Normal cardiovascular exam Rhythm:Regular Rate:Normal     Neuro/Psych negative neurological ROS  negative psych ROS   GI/Hepatic negative GI ROS, Neg liver ROS,,,  Endo/Other  diabetes, Type 2Hypothyroidism    Renal/GU negative Renal ROS  negative genitourinary   Musculoskeletal negative musculoskeletal ROS (+)    Abdominal   Peds negative pediatric ROS (+)  Hematology negative hematology ROS (+)   Anesthesia Other Findings   Reproductive/Obstetrics negative OB ROS                              Anesthesia Physical Anesthesia Plan  ASA: 3  Anesthesia Plan: General   Post-op Pain Management:    Induction: Intravenous  PONV Risk Score and Plan: 2 and Ondansetron , Midazolam  and Treatment may vary due to age or medical condition  Airway Management Planned: LMA  Additional Equipment:   Intra-op Plan:   Post-operative Plan: Extubation in OR  Informed Consent: I have reviewed the patients History and Physical, chart, labs and discussed the procedure including the risks, benefits and alternatives for the proposed anesthesia with the patient or authorized representative who has indicated his/her understanding and acceptance.     Dental advisory given  Plan Discussed with: CRNA  Anesthesia Plan Comments:         Anesthesia Quick Evaluation  "

## 2024-10-24 NOTE — Discharge Instructions (Signed)
Postoperative instructions for Penile Plication  Wound:  Remove the dressing 2 days after surgery. The dressing is tight by design, but if it is causing pain or "pins and needles" in the head of your penis it is ok to remove sooner. In most cases your incision will have absorbable sutures that run along the course of your incision and will dissolve within the first 10-20 days. Some will fall out even earlier. Expect some redness as the sutures dissolved but this should occur only around the sutures. If there is generalized redness, especially with increasing pain or swelling, let us know. The penis will possibly get "black and blue" as the blood in the tissues spread. Sometimes the whole penis will turn colors. The black and blue is followed by a yellow and brown color. In time, all the discoloration will go away.  Diet:  You may return to your normal diet within 24 hours following your surgery. You may note some mild nausea and possibly vomiting the first 6-8 hours following surgery. This is usually due to the side effects of anesthesia, and will disappear quite soon. I would suggest clear liquids and a very light meal the first evening following your surgery.  Activity:  Your physical activity should be restricted the first 48 hours. During that time you should remain relatively inactive, moving about only when necessary. During the first 4 weeks following surgery you should avoid lifting any heavy objects (anything greater than 15 pounds), and avoid strenuous exercise. Do not "use" your penis for anything besides urinating for 1 month. If you work, ask [place] specifically about your restrictions, both for work and home. We will write a note to your employer if needed.  Ice packs can be placed on and off over the penis for the first 48 hours to help relieve the pain and keep the swelling down. Frozen peas or corn in a ZipLock bag can be frozen, used and re-frozen. Fifteen minutes on and 15 minutes off  is a reasonable schedule.  No sexual activity for 1 month.  Hygiene:  You may shower on post operative day 2 after your surgery. Make sure wound is clean and dry afterwards. Tub bathing should be restricted until the seventh day.  Medication:  You will be sent home with some type of pain medication. In many cases you will be sent home with a strong anti-inflammatory medication (toradol, meloxicam, celebrex) and a narcotic pain pill. The the anti-inflammatory first, and the narcotic pain pill if needed. If the pain is not too bad, you may take either Tylenol (acetaminophen) or Advil (ibuprofen) which contain no narcotic agents, and might be tolerated a little better, with fewer side effects. If the pain medication you are sent home with does not control the pain, you will have to let us know. Some narcotic pain medications cannot be given or refilled by a phone call to a pharmacy.  Problems you should report to [place]:  Fever of 101.0 degrees Fahrenheit or greater. Moderate or severe swelling under the skin incision or involving the scrotum. Drug reaction such as hives, a rash, nausea or vomiting.  Difficulty voiding

## 2024-10-24 NOTE — Op Note (Signed)
 Preoperative diagnosis:  1. Peyronie's disease  Postoperative diagnosis: same  Procedure(s): 1. Penile plication  Surgeon: Dr. Herlene Foot  Assistant: none  Anesthesia: general  Complications: none  EBL: 10 cc  Specimens: none  Intraoperative findings: patient with <20 degrees residual curvature after plication for dorsal penile curvature  Indication: The patient has clinically significant Peyronie's disease and elected to proceed with operative intervention.  Description of procedure:  With appropriate consent having been obtained, the patient was brought to the operative suite. Anesthesia was induced, and he was prepped and draped in the usual sterile fashion. A timeout was performed  A dorsal and circumferential penile block was performed with 20 cc of 0.25% marcaine . A circumferential incision was made through the skin of the distal shaft. The layers of the penis were carefully dissected through until Buck's fascia was reached. The penis was degloved down to the penoscrotal junction  A butterfly needle was inserted into the corpora and an artificial erection was induced. It confirmed the findings from the patient's injection and measuring outpatient visit (~40 degrees dorsal curvature mid/distal shaft). The point of max curvature was marked  Buck's fascia was incised longitudinally on the right and left just lateral to the urethra. Buck's was then dissected free enough so as to have room for our plication sutures. Allis clamps were applied where I estimated stay sutures should go, and another artificial erection was induced. Once the planned location of the stay sutures was confirmed, the Allis clamps were removed and plication sutures were placed using 3-0 ethibond. These were carefully synched down so as to maintain tension. 3 plication sutures were placed on each corpora (6 total). Another test erection was done - there was less than 20 degrees of dorsal  curvature.  Hemostasis was achieved with the bipolar. Buck's fascia was closed with interrupted 4-0 chromic, and a dartos layer was re-approximated with 3-0 chromic. Skin was closed with running 3-0 chromic, and a bandage was applied.  Herlene Foot MD [facility]  Pager: [phone]

## 2024-10-25 ENCOUNTER — Encounter (HOSPITAL_COMMUNITY): Payer: Self-pay | Admitting: Urology
